# Patient Record
Sex: Female | Born: 1960 | Race: Black or African American | Hispanic: No | Marital: Married | Smoking: Never smoker
Health system: Southern US, Community
[De-identification: ages and names within clinical notes are randomized; demographics above are authoritative.]

## PROBLEM LIST (undated history)

## (undated) DIAGNOSIS — M21371 Foot drop, right foot: Principal | ICD-10-CM

## (undated) DIAGNOSIS — E7800 Pure hypercholesterolemia, unspecified: Secondary | ICD-10-CM

## (undated) DIAGNOSIS — F319 Bipolar disorder, unspecified: Secondary | ICD-10-CM

## (undated) DIAGNOSIS — I10 Essential (primary) hypertension: Secondary | ICD-10-CM

## (undated) DIAGNOSIS — F419 Anxiety disorder, unspecified: Secondary | ICD-10-CM

## (undated) HISTORY — DX: Essential (primary) hypertension: I10

## (undated) HISTORY — DX: Bipolar disorder, unspecified: F31.9

## (undated) HISTORY — DX: Pure hypercholesterolemia, unspecified: E78.00

## (undated) HISTORY — PX: PARTIAL HYSTERECTOMY: SHX80

## (undated) HISTORY — DX: Anxiety disorder, unspecified: F41.9

## (undated) HISTORY — DX: Foot drop, right foot: M21.371

## (undated) HISTORY — PX: BREAST SURGERY: SHX581

## (undated) HISTORY — PX: BREAST CYST EXCISION: SHX579

---

## 1996-10-01 HISTORY — PX: HIP SURGERY: SHX245

## 2001-07-12 ENCOUNTER — Encounter: Payer: Self-pay | Admitting: Emergency Medicine

## 2001-07-12 ENCOUNTER — Inpatient Hospital Stay (HOSPITAL_COMMUNITY): Admission: EM | Admit: 2001-07-12 | Discharge: 2001-07-14 | Payer: Self-pay | Admitting: *Deleted

## 2001-07-13 ENCOUNTER — Encounter: Payer: Self-pay | Admitting: Internal Medicine

## 2001-09-06 ENCOUNTER — Encounter: Payer: Self-pay | Admitting: Emergency Medicine

## 2001-09-07 ENCOUNTER — Inpatient Hospital Stay (HOSPITAL_COMMUNITY): Admission: EM | Admit: 2001-09-07 | Discharge: 2001-09-08 | Payer: Self-pay | Admitting: Emergency Medicine

## 2002-09-12 ENCOUNTER — Emergency Department (HOSPITAL_COMMUNITY): Admission: EM | Admit: 2002-09-12 | Discharge: 2002-09-12 | Payer: Self-pay | Admitting: Emergency Medicine

## 2002-09-12 ENCOUNTER — Encounter: Payer: Self-pay | Admitting: Emergency Medicine

## 2002-09-18 ENCOUNTER — Ambulatory Visit (HOSPITAL_COMMUNITY): Admission: RE | Admit: 2002-09-18 | Discharge: 2002-09-18 | Payer: Self-pay | Admitting: *Deleted

## 2002-10-13 ENCOUNTER — Encounter
Admission: RE | Admit: 2002-10-13 | Discharge: 2003-01-11 | Payer: Self-pay | Admitting: Physical Medicine & Rehabilitation

## 2002-12-23 ENCOUNTER — Ambulatory Visit (HOSPITAL_BASED_OUTPATIENT_CLINIC_OR_DEPARTMENT_OTHER): Admission: RE | Admit: 2002-12-23 | Discharge: 2002-12-23 | Payer: Self-pay | Admitting: General Surgery

## 2002-12-23 ENCOUNTER — Encounter (INDEPENDENT_AMBULATORY_CARE_PROVIDER_SITE_OTHER): Payer: Self-pay | Admitting: *Deleted

## 2003-03-17 ENCOUNTER — Ambulatory Visit (HOSPITAL_COMMUNITY): Admission: RE | Admit: 2003-03-17 | Discharge: 2003-03-17 | Payer: Self-pay | Admitting: *Deleted

## 2003-03-17 ENCOUNTER — Encounter: Payer: Self-pay | Admitting: *Deleted

## 2005-03-02 ENCOUNTER — Ambulatory Visit (HOSPITAL_COMMUNITY): Admission: RE | Admit: 2005-03-02 | Discharge: 2005-03-02 | Payer: Self-pay | Admitting: *Deleted

## 2005-03-12 ENCOUNTER — Ambulatory Visit (HOSPITAL_COMMUNITY): Admission: RE | Admit: 2005-03-12 | Discharge: 2005-03-12 | Payer: Self-pay | Admitting: Internal Medicine

## 2005-03-12 ENCOUNTER — Other Ambulatory Visit: Admission: RE | Admit: 2005-03-12 | Discharge: 2005-03-12 | Payer: Self-pay | Admitting: Internal Medicine

## 2005-03-25 ENCOUNTER — Emergency Department (HOSPITAL_COMMUNITY): Admission: EM | Admit: 2005-03-25 | Discharge: 2005-03-26 | Payer: Self-pay | Admitting: Emergency Medicine

## 2005-03-29 ENCOUNTER — Encounter: Admission: RE | Admit: 2005-03-29 | Discharge: 2005-03-29 | Payer: Self-pay | Admitting: Internal Medicine

## 2005-04-04 ENCOUNTER — Encounter: Admission: RE | Admit: 2005-04-04 | Discharge: 2005-04-04 | Payer: Self-pay | Admitting: Internal Medicine

## 2005-04-27 ENCOUNTER — Ambulatory Visit (HOSPITAL_BASED_OUTPATIENT_CLINIC_OR_DEPARTMENT_OTHER): Admission: RE | Admit: 2005-04-27 | Discharge: 2005-04-27 | Payer: Self-pay | Admitting: General Surgery

## 2005-04-27 ENCOUNTER — Ambulatory Visit (HOSPITAL_COMMUNITY): Admission: RE | Admit: 2005-04-27 | Discharge: 2005-04-27 | Payer: Self-pay | Admitting: General Surgery

## 2005-04-27 ENCOUNTER — Encounter (INDEPENDENT_AMBULATORY_CARE_PROVIDER_SITE_OTHER): Payer: Self-pay | Admitting: *Deleted

## 2006-06-05 ENCOUNTER — Encounter: Admission: RE | Admit: 2006-06-05 | Discharge: 2006-06-05 | Payer: Self-pay | Admitting: General Surgery

## 2006-06-13 ENCOUNTER — Encounter: Admission: RE | Admit: 2006-06-13 | Discharge: 2006-06-13 | Payer: Self-pay | Admitting: General Surgery

## 2006-06-18 ENCOUNTER — Inpatient Hospital Stay (HOSPITAL_COMMUNITY): Admission: EM | Admit: 2006-06-18 | Discharge: 2006-06-21 | Payer: Self-pay | Admitting: Emergency Medicine

## 2006-06-27 ENCOUNTER — Other Ambulatory Visit: Admission: RE | Admit: 2006-06-27 | Discharge: 2006-06-27 | Payer: Self-pay | Admitting: Obstetrics and Gynecology

## 2006-06-29 ENCOUNTER — Emergency Department (HOSPITAL_COMMUNITY): Admission: EM | Admit: 2006-06-29 | Discharge: 2006-06-29 | Payer: Self-pay | Admitting: Emergency Medicine

## 2006-08-02 ENCOUNTER — Encounter: Admission: RE | Admit: 2006-08-02 | Discharge: 2006-08-02 | Payer: Self-pay | Admitting: Internal Medicine

## 2006-09-13 ENCOUNTER — Encounter (INDEPENDENT_AMBULATORY_CARE_PROVIDER_SITE_OTHER): Payer: Self-pay | Admitting: Specialist

## 2006-09-13 ENCOUNTER — Ambulatory Visit (HOSPITAL_COMMUNITY): Admission: RE | Admit: 2006-09-13 | Discharge: 2006-09-14 | Payer: Self-pay | Admitting: Obstetrics and Gynecology

## 2006-11-26 ENCOUNTER — Encounter: Admission: RE | Admit: 2006-11-26 | Discharge: 2006-11-26 | Payer: Self-pay | Admitting: Internal Medicine

## 2007-08-07 ENCOUNTER — Encounter: Admission: RE | Admit: 2007-08-07 | Discharge: 2007-08-07 | Payer: Self-pay | Admitting: Internal Medicine

## 2008-03-02 IMAGING — CT CT PELVIS W/ CM
2 of 5 series · 14 of 32 positions shown, 19 images · IV contrast (OMNI 350 25 ML & [ID] OMNI 300)
Comparison: None.

CLINICAL DATA: Mid abdominal pain with nausea and abdominal distention.
 ABDOMEN CT WITH CONTRAST:
TECHNIQUE: Multidetector CT imaging of the abdomen was performed following the standard protocol during bolus administration of intravenous contrast.
 Contrast:  100 cc Omnipaque 300 and oral contrast.
TECHNIQUE: Multidetector CT imaging of the pelvis was performed following the standard protocol during bolus administration of intravenous contrast.

[Series 2: abd pelvis · axial · 0.70mm/px · z∈[-396,-71]mm · 8 of 85 slices shown, 13 images]
[im 10/85  soft-tissue]
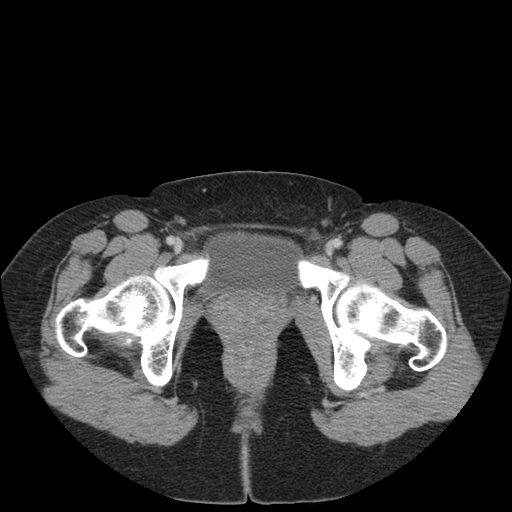
[im 10/85  bone]
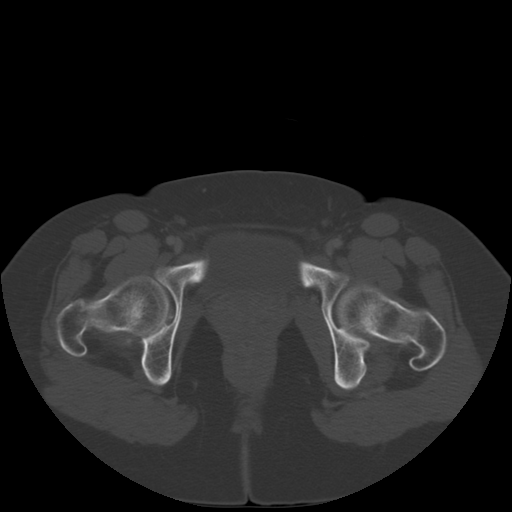
[im 19/85  soft-tissue]
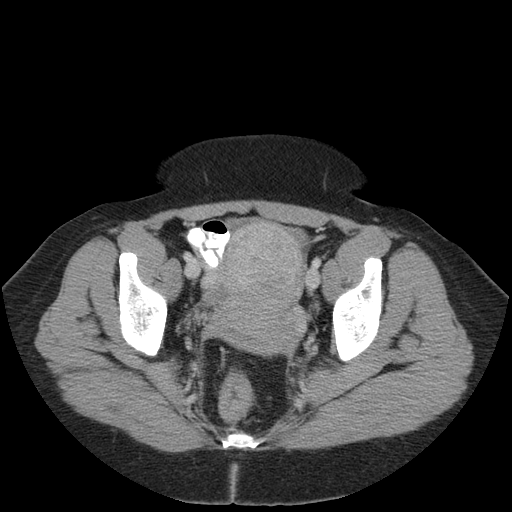
[im 29/85  soft-tissue]
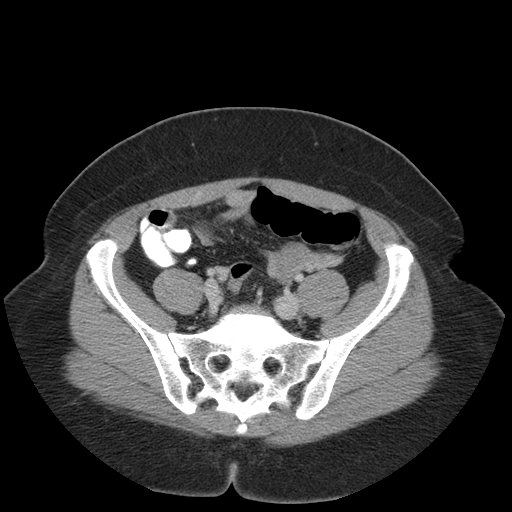
[im 38/85  soft-tissue]
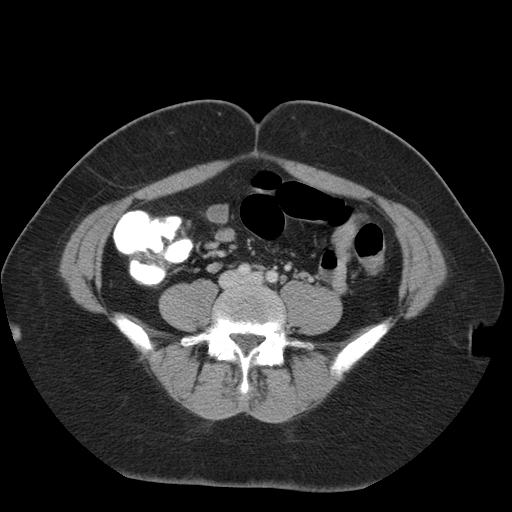
[im 47/85  soft-tissue]
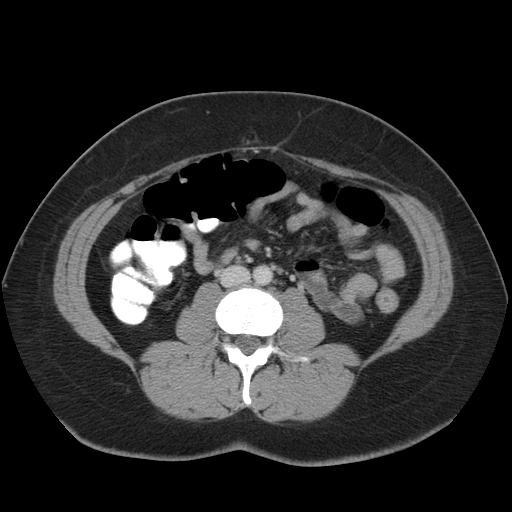
[im 47/85  lung]
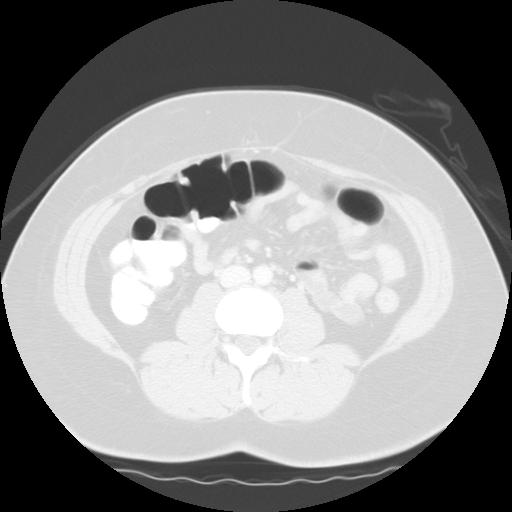
[im 57/85  soft-tissue]
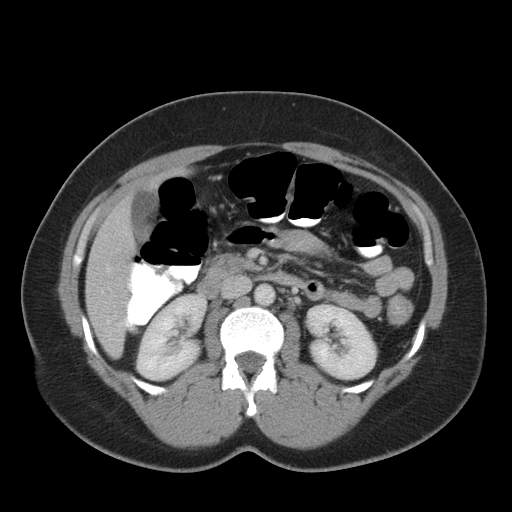
[im 57/85  lung]
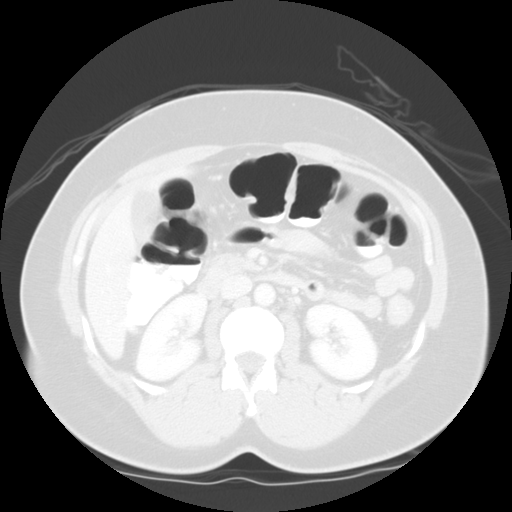
[im 66/85  soft-tissue]
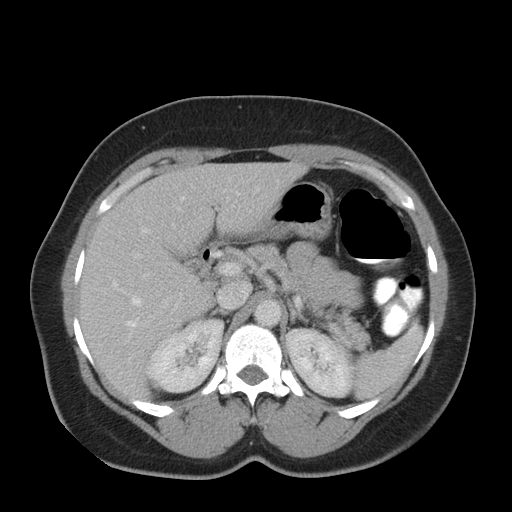
[im 66/85  lung]
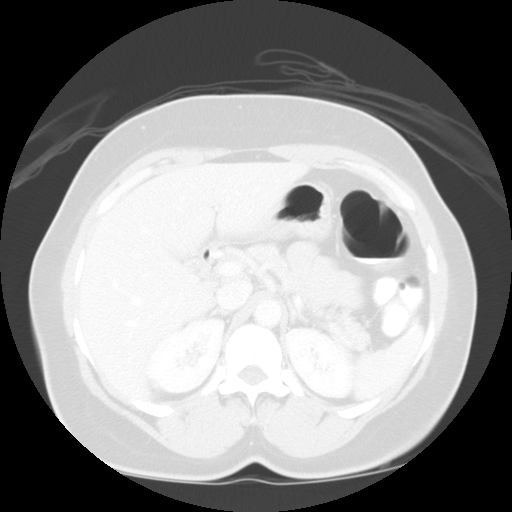
[im 75/85  soft-tissue]
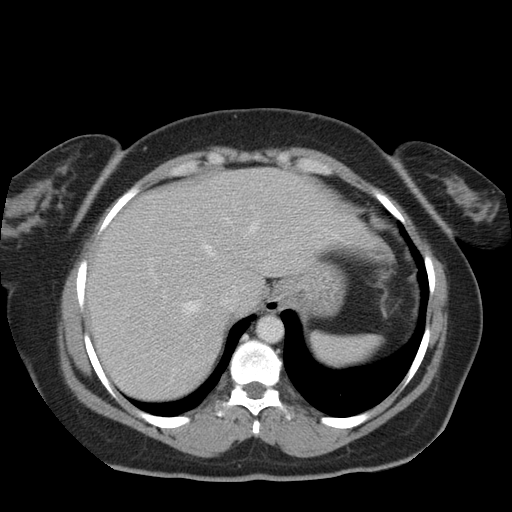
[im 75/85  lung]
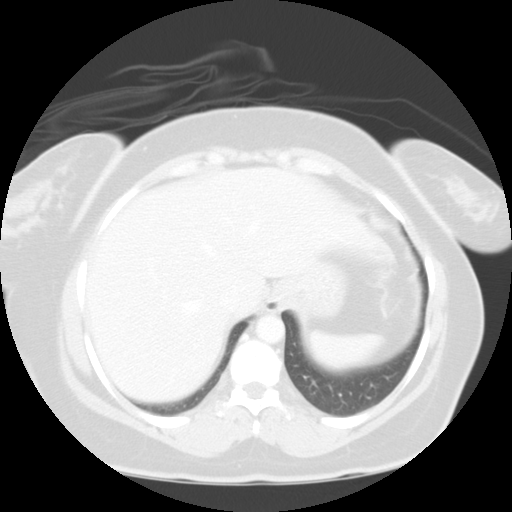

[Series 400: reformatted · sagittal · 0.85mm/px · 6 of 73 slices shown]
[im 10/73  soft-tissue]
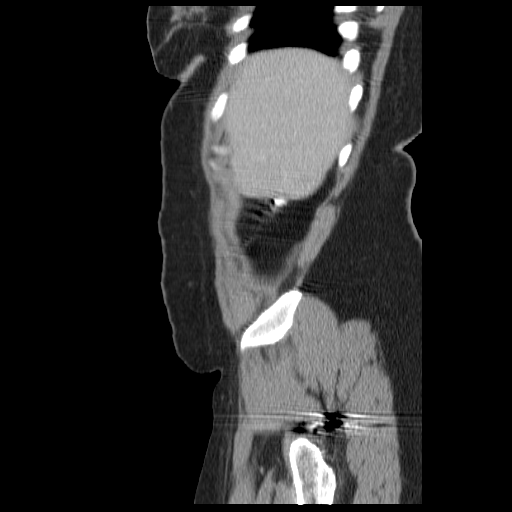
[im 19/73  soft-tissue]
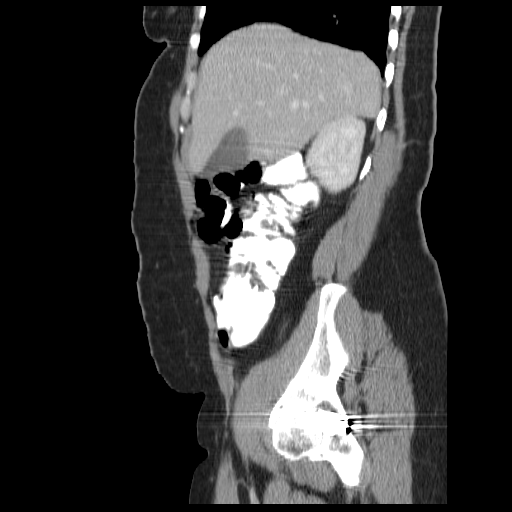
[im 28/73  soft-tissue]
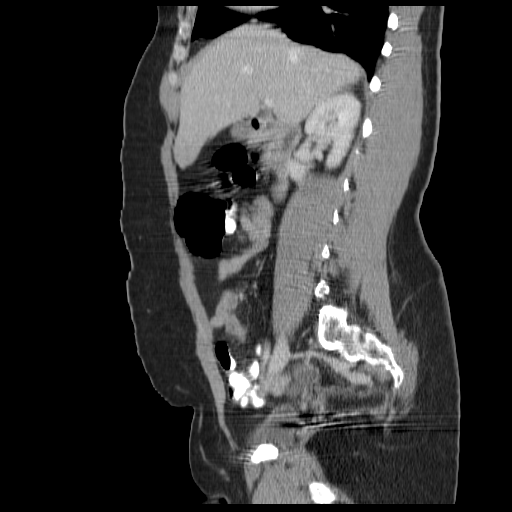
[im 37/73  soft-tissue]
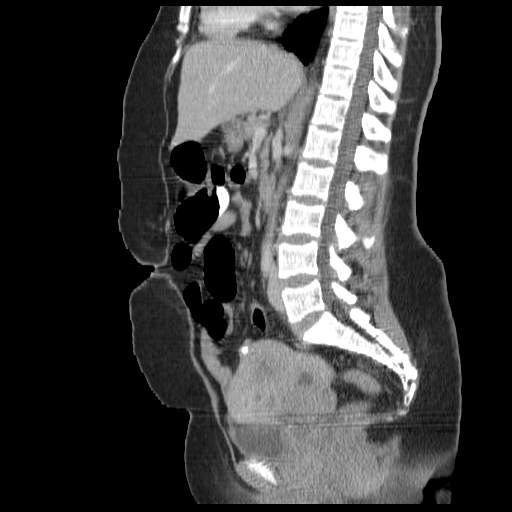
[im 46/73  soft-tissue]
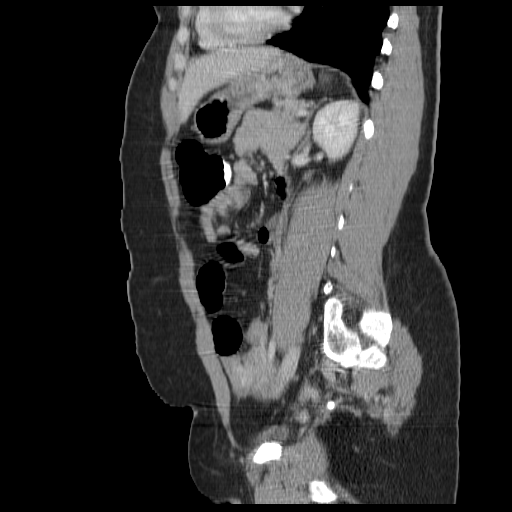
[im 55/73  soft-tissue]
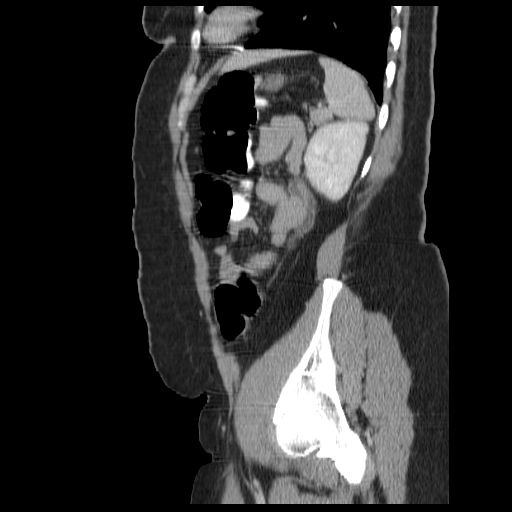

[14 of 32 positions shown; findings below may reference images not displayed]

FINDINGS: Lung bases are clear. :  The abdominal parenchymal organs are unremarkable.  There is no evidence of mass or adenopathy.  No inflammatory process or abnormal fluid collections are identified.  No other significant abnormality noted.
IMPRESSION: Negative abdomen CT.  
 PELVIS CT WITH CONTRAST:
FINDINGS: The uterus appears lobulated and enlarged, suggesting uterine fibroid disease.  The left ovary is relatively high in position, situated above the left aspect of the uterine dome. This is a variant of normal.  There is a probable small right ovarian cyst.  There is no free fluid in the cul-de-sac. No adenopathy or other acute/pathological findings. No inflammatory changes of the large or small bowel. Appendix normal.
IMPRESSION: Probable fibroid uterus and probable small right ovarian cyst ? no acute or significant findings.

## 2008-08-02 ENCOUNTER — Other Ambulatory Visit: Admission: RE | Admit: 2008-08-02 | Discharge: 2008-08-02 | Payer: Self-pay | Admitting: Internal Medicine

## 2009-03-17 ENCOUNTER — Inpatient Hospital Stay (HOSPITAL_COMMUNITY): Admission: AD | Admit: 2009-03-17 | Discharge: 2009-03-18 | Payer: Self-pay | Admitting: Obstetrics and Gynecology

## 2009-08-31 ENCOUNTER — Encounter: Admission: RE | Admit: 2009-08-31 | Discharge: 2009-08-31 | Payer: Self-pay | Admitting: Internal Medicine

## 2009-09-07 ENCOUNTER — Encounter: Admission: RE | Admit: 2009-09-07 | Discharge: 2009-09-07 | Payer: Self-pay | Admitting: Internal Medicine

## 2009-11-20 ENCOUNTER — Observation Stay (HOSPITAL_COMMUNITY): Admission: EM | Admit: 2009-11-20 | Discharge: 2009-11-21 | Payer: Self-pay | Admitting: Emergency Medicine

## 2009-11-20 ENCOUNTER — Ambulatory Visit: Payer: Self-pay | Admitting: Internal Medicine

## 2009-12-20 ENCOUNTER — Encounter: Admission: RE | Admit: 2009-12-20 | Discharge: 2009-12-20 | Payer: Self-pay | Admitting: Internal Medicine

## 2010-10-23 ENCOUNTER — Encounter: Payer: Self-pay | Admitting: Internal Medicine

## 2010-12-20 LAB — POCT CARDIAC MARKERS
CKMB, poc: <1 ng/mL — ABNORMAL LOW (ref 1.0–8.0)
Myoglobin, poc: 69.4 ng/mL (ref 12–200)
Troponin i, poc: <0.05 ng/mL (ref 0.00–0.09)

## 2010-12-20 LAB — BASIC METABOLIC PANEL
Calcium: 9.3 mg/dL (ref 8.4–10.5)
Creatinine, Ser: 0.96 mg/dL (ref 0.4–1.2)
GFR calc Af Amer: >60 mL/min (ref >60)
GFR calc non Af Amer: >60 mL/min (ref >60)
Sodium: 135 mEq/L (ref 135–145)

## 2010-12-20 LAB — LIPID PANEL
Cholesterol: 242 mg/dL — ABNORMAL HIGH (ref 0–200)
HDL: 46 mg/dL (ref >39)
LDL Cholesterol: 152 mg/dL — ABNORMAL HIGH (ref 0–99)
Triglycerides: 222 mg/dL — ABNORMAL HIGH (ref <150)

## 2010-12-20 LAB — CBC
Hemoglobin: 12.7 g/dL (ref 12.0–15.0)
MCHC: 34.1 g/dL (ref 30.0–36.0)
RBC: 4.18 MIL/uL (ref 3.87–5.11)
RDW: 14.5 % (ref 11.5–15.5)

## 2010-12-20 LAB — DIFFERENTIAL
Basophils Absolute: 0.1 10*3/uL (ref 0.0–0.1)
Basophils Relative: 1 % (ref 0–1)
Lymphocytes Relative: 35 % (ref 12–46)
Monocytes Absolute: 0.6 10*3/uL (ref 0.1–1.0)
Monocytes Relative: 6 % (ref 3–12)
Neutro Abs: 5.9 10*3/uL (ref 1.7–7.7)
Neutrophils Relative %: 57 % (ref 43–77)

## 2010-12-20 LAB — CARDIAC PANEL(CRET KIN+CKTOT+MB+TROPI)
CK, MB: 0.7 ng/mL (ref 0.3–4.0)
Relative Index: 0.3 (ref 0.0–2.5)
Relative Index: 0.5 (ref 0.0–2.5)
Total CK: 212 U/L — ABNORMAL HIGH (ref 7–177)
Troponin I: 0.04 ng/mL (ref 0.00–0.06)

## 2010-12-20 LAB — TROPONIN I: Troponin I: 0.04 ng/mL (ref 0.00–0.06)

## 2010-12-20 LAB — D-DIMER, QUANTITATIVE: D-Dimer, Quant: 0.22 ug/mL-FEU (ref 0.00–0.48)

## 2010-12-20 LAB — TSH: TSH: 1.896 u[IU]/mL (ref 0.350–4.500)

## 2011-01-08 LAB — URINALYSIS, ROUTINE W REFLEX MICROSCOPIC
Hgb urine dipstick: NEGATIVE
Nitrite: NEGATIVE
Protein, ur: NEGATIVE mg/dL
Specific Gravity, Urine: <1.005 — ABNORMAL LOW (ref 1.005–1.030)
Urobilinogen, UA: 0.2 mg/dL (ref 0.0–1.0)

## 2011-01-08 LAB — CBC
Hemoglobin: 11.5 g/dL — ABNORMAL LOW (ref 12.0–15.0)
MCHC: 34 g/dL (ref 30.0–36.0)
Platelets: 414 10*3/uL — ABNORMAL HIGH (ref 150–400)
RDW: 14.6 % (ref 11.5–15.5)

## 2011-01-08 LAB — POCT PREGNANCY, URINE: Preg Test, Ur: NEGATIVE

## 2011-02-16 NOTE — Op Note (Signed)
NAME:  Andrea Bean, Andrea Bean              ACCOUNT NO.:  [id number]   MEDICAL RECORD NO.:  [id number]          PATIENT TYPE:  OIB   LOCATION:  1509                         FACILITY:  [facility]   PHYSICIAN:  Crist Fat. Rivard, M.D. DATE OF BIRTH:  24-Dec-1960   DATE OF PROCEDURE:  09/13/2006  DATE OF DISCHARGE:                               OPERATIVE REPORT   PREOP DIAGNOSIS:  Uterine fibroids.   POSTOP DIAGNOSIS:  Uterine fibroids.   ANESTHESIA:  General, Dr. Leta Jungling.   PROCEDURE:  Laparoscopy myomectomy robotically assisted.   SURGEON:  Crist Fat. Rivard, M.D.   ASSISTANTMarquis Lunch. Lowell Guitar, P.A.   DESCRIPTION OF PROCEDURE:  After being informed of the planned procedure  with possible complications; including bleeding, infection, injury to  bowels, bladder, ureters, need for laparotomy as well as length of  procedure, and the dependent facial postop edema; informed consent is  obtained.  The patient is taken to OR #10 given general anesthesia with  endotracheal intubation without complication.  She is placed on an  sliding mattress the arms are wrapped and padded.  PAS knee-high  stockings are placed; and she is placed in a lithotomy position.  Shoulder pads are also placed.  She is prepped and draped in a sterile  fashion; and a Foley catheter is inserted in her bladder.  GYN exam  reveals an anteverted uterus about 12-14 weeks in size, mobile with a  dominant anterior fibroid, but otherwise her pelvic exam is limited by  body habitus.   A weighted speculum is inserted.  The anterior lip of the cervix is  grasped with a tenaculum and forceps; and the uterus is sounded at 10  cm.  Using a #10 RUMI manipulator we inserted easily and inflated the  balloon with 7 mL of air.  Speculum is removed; tenaculum is removed.  We then proceeded with the supraumbilical trocar placement about 5 cm  above the umbilicus.  We infiltrated with 5 mL of Marcaine 0.25;  performed a 10-mm incision with knife;  and bluntly brought this to the  fascia with S-retractors.  The fascia is identified, grasped, and  incised with Mayo forceps; and the peritoneum is entered bluntly.  A  pursestring suture of #0 Vicryl is then placed on the fascia; and a  Hassan long trocar is easily inserted and held in place with a  pursestring suture.  We can now insufflate a pneumoperitoneum with CO2  at a maximum pressure of 15 mmHg.   We then determined our trocar placement; and placed an 8 mm da Vinci  trocar 15 cm below the camera trocar on the left; and on the right side.  A third da Vinci trocar 8-mm is inserted 10 cm in the direct line in the  left flank area.  Before inserting these trocars, all the sites are  infiltrated with Marcaine 0.25.  We finally placed a 10-mm disposable  trocar 15 cm directly across our camera port on the right side of the  patient for patient-side assistance.  These trocars are all placed under  direct visualization.  The camera is then  used to evaluate the pelvic  and the abdominal cavity.  Observation:  Anterior cul-de-sac is normal;  posterior cul-de-sac is normal; both tubes are status post bilateral  tubal ligation; and both ovaries are normal.  We noted a dominant  intramural anterior and fundal fibroid measuring 5.5 cm.  A posterior,  mid body, largely pedunculated fibroid measuring 6 x 3 cm; a  pedunculated left cornual fibroid measuring 2 cm; and a subserosal  fundal fibroid measuring 1 cm.  Appendix is visualized and normal.  Liver is visualized and normal.  Gallbladder is not visualized.  No  other anomalies are observed in the abdominal pelvic cavity. We then  proceed with docking of the robot which is done without complication.   Console time now starts; and we proceeded with infiltration of the  dominant anterior fibroid using vasopressin 20 units and 20 mL  dilatation until we see complete blanching of the serosa.  Using the  monopolar scissors and bipolar PK forceps   we then incised the serosa of  the uterus on a 4-cm distance.  With our third robotic arm using a large  tenaculum we can then grasp the fibroid; and then bluntly and sharply  dissect it out of the myometrium.  It is then deposited in the posterior  cul-de-sac; and we proceeded with closure of the myometrium in two  layers with figure-of-eight stitches of #0 Vicryl.  We can then close  the serosa with a baseball suture of 2-0 Vicryl.  Hemostasis is  adequate.  We then proceed with the posterior myomectomy; and, again, we  infiltrate the serosa with vasopressin 20 units and 20 mL dilution, and  with monopolar scissors.  We incise the serosa about 1 cm away from the  uterine wall.  The tenaculum forceps grasped the fibroid; and sharply  and bluntly using bipolar PK forceps and monopolar scissors, we are able  to completely enucleate the fibroid away.  It is also deposited in the  posterior cul-de-sac; and we proceed with one-layer closure of #0 Vicryl  figure-of-eight stitches.  The serosa is partially closed with a  baseball suture of 2-0 Vicryl.  For hemostatic purposes, on the left  angle of our incision, we placed a through-and-through figure-of-eight  stitch of #0 Vicryl.  Hemostasis is now deemed adequate.  Using PK  forceps.  We cauterized the base of the left cornual fibroid, incised  it, and removed it.  It is deposited in the anterior cul-de-sac.  Finally using PK forceps and monopolar scissors, we removed the  subserosal fundal fibroid easily; and both of these surfaces are  cauterized with PK forceps   The 10-mm assistant trocar is removed to placed the Gynecare morcellater  under direct visualization.  We then proceed with systematic  morcellation of all 4 previously-mentioned fibroids without  complication.  We then irrigate the pelvis profusely with warm saline  and note a satisfactory hemostasis.  Tisseel is then applied to the anterior incision as well as to the posterior  incision; and again  hemostasis is adequate.  The [employer] robot is then undocked and all  trocars are removed under direct visualization after evacuating the  pneumoperitoneum.  The previously placed pursestring suture of the  camera trocar is closed making sure no bowel loop is protruding and  tied.  We proceeded with closure of the fascia of the now 12-mm right  flank incision with a running suture of #0 Vicryl and then skin of all  five incisions  is closed with Dermabond.   Instruments and sponge counts are complete x2.  Estimated blood loss is  100 mL.  The complete weight of all for fibroid is 150 grams.  Urine  output was 375 mL and the patient received 3400 mL of crystalloids.  The  procedure was very well tolerated by the patient who is taken to  recovery room in a well and stable condition.      Crist Fat Rivard, M.D.  Electronically Signed     SAR/MEDQ  D:  09/13/2006  T:  09/13/2006  Job:  [id number]

## 2011-02-16 NOTE — H&P (Signed)
NAME:  Andrea Bean, Andrea Bean              ACCOUNT NO.:  [id number]   MEDICAL RECORD NO.:  [id number]          PATIENT TYPE:  AMB   LOCATION:  DAY                          FACILITY:  [facility]   PHYSICIAN:  Crist Fat. Rivard, M.D. DATE OF BIRTH:  1960-11-26   DATE OF ADMISSION:  DATE OF DISCHARGE:                              HISTORY & PHYSICAL   REASON FOR ADMISSION:  Uterine fibroids with menorrhagia.   HISTORY OF PRESENT ILLNESS:  This is a 50 year old married [place]  American female, gravida 4, para 2, who consulted [name] in September 2007,  complaining of menorrhagia.  She reports an increase in her menstrual  flow in the last year although her cycle is monthly in frequency.  The  duration of her flow is about seven days and requires the use of one  tampon and one pad every hour.  She does report passing clots as large  as 3 cm.  The flow is heavy enough that it keeps her from pursuing her  social activities.  She also reports significant dysmenorrhea 48 hours  prior to the flow and continuing for another 48 hours.  Dysmenorrhea is  quoted of an intensity of 10/10 and radiates to her back.   An ultrasound performed July 10, 2006, reports an enlarged uterus  measuring 9.4 x 7.6 x 6.7 cm with an endometrial thickness of 1.2 cm at  day 31 of the cycle.  Right ovary is slightly enlarged with no cystic  structure but negative blood flow.  Left ovary is normal.  We do note an  anterior intramural fibroid that measures 4.2 x 4.2 x 4.8 cm.  A CT scan  also performed in October 2007, noted the same findings.  An endometrial  biopsy performed July 10, 2006, revealed benign proliferative  endometrium, no hyperplasia or malignancy identified.  In September of  2007, her hemoglobin was slightly decreased at 11.7 and she had a  normal TSH.  She comes today seeking surgical treatment and has  requested a myomectomy despite the fact that she has a bilateral tubal  ligation.  Mrs. Andrea Bean desires a  left invasive surgery to reduce her  risk of possible complications.  She understands the possibility of  having to request a hysterectomy at a later time.   CURRENT MEDICATIONS:  1. Amitiza 24 mcg daily.  2. Lipitor 20 mg daily.  3. Trazodone 50 mg 1-3 times daily.  4. Nexium 40 mg daily.  5. Cymbalta 60 mg in the morning.  6. Abilify 10 mg daily.  7. MiraLax daily as needed.   ALLERGIES:  NO KNOWN DRUG ALLERGIES.   REVIEW OF SYSTEMS:  CONSTITUTIONAL:  Negative.  PSYCHIATRIC:  Patient is  currently followed by Dr. Evelene Croon for bipolar disorder with a schizophrenia  component.  She also has depression and superimposed panic attacks.  CARDIOVASCULAR:  Negative.  GASTROINTESTINAL:  Negative.  GENITOURINARY:  Negative, otherwise specified in HPI.  UROLOGICAL:  Negative.   PAST MEDICAL HISTORY:  1. Bilateral tubal ligation.  2. Status post removal of right breast fibroadenoma.  3. Status post hip surgery.  4. Bipolar disorder  with schizophrenia component and superimposed      panic attacks.  5. Migraines.  6. Hypercholesterolemia.  7. Past history of gastric ulcer with pain medication.  8. Gravida 4, para 2, abortus 2.   FAMILY HISTORY:  Paternal grandmother with breast cancer.  Son with  leukemia.   SOCIAL HISTORY:  Married.  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   OTHER LABORATORY EVALUATIONS:  Pap smear June 27, 2006, normal.   PHYSICAL EXAMINATION:  VITAL SIGNS:  Current weight is 183 pounds for a  height of 5 feet 6 inches.  Blood pressure 120/88.  HEENT:  Normal.  NECK:  Thyroid not enlarged.  CHEST:  Clear.  CARDIOVASCULAR:  Regular rate and rhythm.  BREASTS:  No mass, discharge or nipple retractions.  BACK:  No CVA tenderness.  ABDOMEN:  No tenderness, masses or hepatosplenomegaly.  PELVIC:  Normal external genitalia, normal vagina.  Cervix is normal.  Uterus is slightly increased in size, 8-10 weeks, mobile and nontender.  Adnexa are negative.  RECTOVAGINAL:   Negative.  EXTREMITIES:  Negative.  NEUROLOGIC:  Within normal limits.   ASSESSMENT:  Uterine fibroids with menorrhagia and dysmenorrhea in a  patient desiring myomectomy.   PLAN:  We have offered robotic assisted myomectomy. The procedure has  been thoroughly reviewed with the patient including length of procedure  which is longer than traditional surgery including the possibility of  requiring a laparotomy.  Hospital stay and recovery were also reviewed.  Possible risks of the procedure were reviewed including, but not limited  to, bleeding, infection, injury to bladder, bowels or ureters, need for  laparotomy.   Other options of treatment were reviewed with the patient including  medical treatment of symptoms as well as hysterectomy.  The patient  understands all her options and is requesting a robotic assisted  myomectomy despite having had a previous tubal ligation.  Fertility  issues are not a concern but the patient does request a minimally  invasive surgery without removal of any organ.  She is aware that  fibroids can grow again and she is aware that her symptoms could  persist.  She understands that she may require more treatment in the  future.      Crist Fat Rivard, M.D.  Electronically Signed     SAR/MEDQ  D:  09/12/2006  T:  09/12/2006  Job:  [id number]

## 2011-02-16 NOTE — Op Note (Signed)
NAME:  Andrea Bean, Andrea Bean              ACCOUNT NO.:  [id number]   MEDICAL RECORD NO.:  [id number]          PATIENT TYPE:  AMB   LOCATION:  DSC                          FACILITY:  [facility]   PHYSICIAN:  Angelia Mould. Derrell Lolling, M.D.DATE OF BIRTH:  Feb 27, 1961   DATE OF PROCEDURE:  04/27/2005  DATE OF DISCHARGE:                                 OPERATIVE REPORT   PREOPERATIVE DIAGNOSIS:  Right nipple discharge.   POSTOPERATIVE DIAGNOSIS:  Right nipple discharge.   OPERATION PERFORMED:  Excision of right breast tissue, subareolar, and  excision of major lactiferous duct.   SURGEON:  Angelia Mould. Derrell Lolling, M.D.   OPERATIVE INDICATIONS:  This is a 50 year old black female who has had a  persistent drainage from her right nipple for 6 months. This has sometimes  been bloody sometimes light yellow. Mammograms and ultrasound and ductogram  failed to demonstrate a mass or a filling defect. The drainage persists and  she would like to have this area excised because she is fearful of cancer.  On exam, there is no palpable mass but I can elicit a slightly cloudy  discharge from the right nipple in one of the central ducts.   TECHNIQUE:  The patient brought to operating room. General anesthesia was  induced. The right breast was prepped and draped in sterile fashion. The  patient was identified. I was able to elicit the drainage from the central  right nipple in the center of the nipple and I was able to pass a lacrimal  duct probe easily into this duct. 0.5% Marcaine with epinephrine was used as  local infiltration anesthetic. A curved incision was made at the lower rim  of the areolar margin. Dissection was carried down into the breast tissue in  the subareolar area. We dissected medially until we encountered the lacrimal  duct probe. We dissected around this and marked this site with a Vicryl  suture and then removed the probe. We then dissected a golf ball sized area  of subareolar tissue encompassing about  2.5 cm in diameter around the  involved duct. I did not palpate any mass. I did not see any blood. The  specimen was sent to the pathology department with appropriate clinical  history attached. The wound was irrigated with saline. Hemostasis was  excellent and achieved with electrocautery. Subcutaneous tissue was closed  with interrupted sutures of 3-0  Vicryl. Skin closed with a running subcuticular suture of 4-0 Monocryl and  Steri-Strips. Clean bandages were placed and the patient taken recovery room  in stable condition. Estimated blood loss was about 10 mL. Complications  none. Sponge, needle, and instrument counts were correct.       HMI/MEDQ  D:  04/27/2005  T:  04/27/2005  Job:  [id number]   cc:   Juline Patch, M.D.  [address]  [place], [place] [address]  Fax: [phone]   Ollen Gross, M.D.
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
## 2011-02-16 NOTE — Op Note (Signed)
NAME:  Andrea Bean, Andrea Bean                        ACCOUNT NO.:  [id number]   MEDICAL RECORD NO.:  [id number]                   PATIENT TYPE:  AMB   LOCATION:  DSC                                  FACILITY:  [facility]   PHYSICIAN:  Angelia Mould. Derrell Lolling, M.D.             DATE OF BIRTH:  1960/11/21   DATE OF PROCEDURE:  12/23/2002  DATE OF DISCHARGE:                                 OPERATIVE REPORT   PREOPERATIVE DIAGNOSIS:  Right breast mass.   POSTOPERATIVE DIAGNOSIS:  Right breast mass.   OPERATION PERFORMED:  Excisional biopsy, right breast mass.   SURGEON:  Angelia Mould. Derrell Lolling, M.D.   INDICATIONS FOR PROCEDURE:  The patient is a 50 year old black female who  has had a lump in the right breast very close to the sternum for about three  years.  She states that this is painful but has been stable in size and she  has a fear that this is cancer.  She has been followed very carefully by  Soyla Murphy. Renne Crigler, M.D. and Juline Patch, M.D. and has had serial breast  imaging which shows no abnormality except for a stable nodule about 1.4 cm  consistent with a lipoma.  Because of her concern about the diagnosis and  the painful nature of this, she is brought to the operating room for  excisional biopsy.   DESCRIPTION OF PROCEDURE:  The patient was placed supine on the operating  table.  She was monitored and sedated by the anesthesia department.  The  right breast was prepped and draped in a sterile fashion.  1% Xylocaine with  epinephrine was used as a local infiltration anesthetic.  A transverse  incision was made in the most medial aspect of the right breast at about the  3 o'clock position.  This was just lateral to the sternum.  Dissection was  carried down into the subcutaneous tissue and the breast tissue and I  removed a 1.5 cm area of fatty tissue consistent with a lipoma.  I was  careful to debride all the tissue in this area.  I did not find any tissue  that felt like a cancer or a  fibroadenoma.  This tissue was sent for routine  histology.  Hemostasis was excellent and achieved with electrocautery.  The  wound was irrigated with saline.  The skin was closed with a running  subcuticular suture of 4-0  Vicryl and Steri-Strips.  Clean bandages were placed and the patient taken  to the recovery room in stable condition.  The estimated blood loss was  about 10 ml.  Complications were none.  Sponge, needle and instrument counts  were correct.  Angelia Mould. Derrell Lolling, M.D.    HMI/MEDQ  D:  12/23/2002  T:  12/23/2002  Job:  [id number]   cc:   Juline Patch, M.D.  [address]  [place], [place] [address]  Fax: [phone]   Soyla Murphy. Renne Crigler, M.D.  [address]  [place]  [place] [address]  Fax: [phone]

## 2011-02-16 NOTE — Discharge Summary (Signed)
Andrea Bean, Andrea Bean              ACCOUNT NO.:  [id number]   MEDICAL RECORD NO.:  [id number]          PATIENT TYPE:  INP   LOCATION:  5729                         FACILITY:  [facility]   PHYSICIAN:  Hillery Aldo, M.D.   DATE OF BIRTH:  12/17/1960   DATE OF ADMISSION:  06/17/2006  DATE OF DISCHARGE:  06/21/2006                                 DISCHARGE SUMMARY   DISCHARGE DIAGNOSES:  1. Abdominal pain of uncertain etiology.  2. Chest pain of uncertain etiology.  3. Bipolar disorder.  4. Constipation.  5. Dyslipidemia.  6. Uterine fibroid disease.  7. Small right ovarian cyst.   DISCHARGE MEDICATIONS:  1. Lipitor 20 mg daily.  2. Abilify 10 mg daily.  3. Alprazolam 1 mg q.4h. p.r.n.  4. Cymbalta 30 mg daily.   CONSULTATIONS:  None.   PROCEDURES AND DIAGNOSTIC STUDIES:  1. Acute abdominal series on June 17, 2006, showed clear lungs      bilaterally.  The cardiopericardial silhouette was within normal limits      for size.  There was no acute cardiopulmonary process and a nonspecific      bowel gas pattern.  2. CT scan of the abdomen and pelvis on June 20, 2006, showed an      enlarged and lobulated uterus suggesting uterine fibroid disease.      There was also a small right ovarian cyst with no other acute or      significant findings.  The abdominal portion of the CT was negative.   DISCHARGE LABORATORY VALUES:  Sodium was 143, potassium 4.4, chloride 107,  bicarb 29, BUN 3, creatinine 1, glucose 96, total bilirubin 0.4, alkaline  phosphatase 78, AST 31, ALT 29, total protein 8.6, albumin 4.3, calcium  10.3.  White blood cell count was 9.5, hemoglobin 12.5, hematocrit 37,  platelet count 555. Urine culture showed insignificant growth.  Cardiac  enzymes were negative x3.  Cholesterol was 220, triglycerides 210, HDL 39,  LDL 139.  TSH was 2.615.  Fecal occult blood testing was negative.   HOSPITAL COURSE BY PROBLEM:  1. Nonspecific complaints of chest and abdominal  pain:  The patient was      admitted initially for chest pain but over the course of her      hospitalization, began to complain more of abdominal pain.  She also      complained of constipation.  Because of her initial chest pain      complaints, cardiac enzymes were cycled q.8h. x3 and were found to be      negative.  Her EKG was unremarkable except for sinus tachycardia.      During the course of her hospitalization, she had no further complaints      of chest pain.  She did complain of some abdominal pain which she      thought might be secondary to constipation.  She was put on aggressive      bowel management and had several stools prior to discharge.  Because of      her ongoing complaints of abdominal pain, a CT scan of the abdomen  and      pelvis were obtained.  The source of her abdominal pain was likely due      to constipation or possibly the ovarian cyst.  Nevertheless, her      abdomen never had any worrisome signs of acute abdomen.  She is stable      for discharge with normal electrolytes and liver function studies.   1. Bipolar disorder:  The patient does have a past medical history of      bipolar disorder.  Some of her symptomatology could have been secondary      to somatization.  Nevertheless, any underlying physiological cause of      her complaints was fully evaluated.  She will be discharged on her      usual regimen of psychotropic medications.   1. Dyslipidemia:  The patient did have elevated cholesterol and LDL with a      low HDL on her lipid profile.  The patient states that she has been on      fish oil supplements for at least six months at the direction of her      primary care physician.  Given this, we will go ahead and start her on      statin therapy.  She should follow up with Dr. Ricki Miller in approximately      six weeks time for recheck of her liver function studies and fasting      lipid panel to determine if she needs any further dose titration of her       statin.   DISPOSITION:  The patient is stable for discharge home.  She is instructed  to have Dr. Ricki Miller refer her to an OB/GYN for ongoing assessment and possible  treatment of her fibroid disease.  She is not currently anemic and so no  immediate need for treatment was necessary in the hospital.      Hillery Aldo, M.D.  Electronically Signed     CR/MEDQ  D:  06/21/2006  T:  06/23/2006  Job:  [id number]   cc:   Juline Patch, M.D.  Georgiana Spinner, M.D.

## 2011-02-16 NOTE — Op Note (Signed)
NAME:  Andrea Bean, Andrea Bean              ACCOUNT NO.:  [id number]   MEDICAL RECORD NO.:  [id number]          PATIENT TYPE:  AMB   LOCATION:  ENDO                         FACILITY:  [facility]   PHYSICIAN:  Georgiana Spinner, M.D.    DATE OF BIRTH:  04-24-1961   DATE OF PROCEDURE:  03/02/2005  DATE OF DISCHARGE:                                 OPERATIVE REPORT   PROCEDURES:  Upper endoscopy with Savary dilation.   INDICATIONS:  Dysphagia.   ANESTHESIA:  Demerol 100 mg, Versed 10 mg.   DESCRIPTION OF PROCEDURE:  With the patient mildly sedated in the left  lateral decubitus position in room 2 of radiology at [facility],  the Olympus videoscopic endoscope was inserted in the mouth and passed under  direct vision through the esophagus which appeared normal. There was no  evidence of Barrett's. We entered into the stomach. The fundus, body,  antrum, duodenal bulb, and second portion of the duodenum were visualized.  From this point, the endoscope was slowly withdrawn taking circumferential  views of the duodenal mucosa until the endoscope had been pulled back into  the stomach and placed in retroflexion to view the stomach from below. The  endoscope was then straightened and a guidewire was passed. The endoscope  was withdrawn taking circumferential views of the remaining gastric and  esophageal mucosa. Subsequently over the guidewire was passed an 18 Savary  dilator with minimal resistance. This done under fluoroscopic guidance. The  guidewire and the dilator were then removed. There was no heme seen on the  dilator and the procedure was terminated. The patient's vital signs and  pulse oximeter remained stable. The patient tolerated procedure well without  complications.   FINDINGS:  Narrowing of distal esophagus seen on x-ray dilated to 18 Savary  dilation.   PLAN:  Await clinical response.      GMO/MEDQ  D:  03/02/2005  T:  03/02/2005  Job:  [id number]

## 2013-01-27 ENCOUNTER — Encounter: Payer: Self-pay | Admitting: Neurology

## 2013-01-27 ENCOUNTER — Ambulatory Visit (INDEPENDENT_AMBULATORY_CARE_PROVIDER_SITE_OTHER): Payer: 59 | Admitting: Neurology

## 2013-01-27 VITALS — BP 158/98 | HR 76 | Temp 97.9°F | Ht 65.5 in | Wt 200.0 lb

## 2013-01-27 DIAGNOSIS — R202 Paresthesia of skin: Secondary | ICD-10-CM

## 2013-01-27 DIAGNOSIS — M79609 Pain in unspecified limb: Secondary | ICD-10-CM

## 2013-01-27 DIAGNOSIS — R209 Unspecified disturbances of skin sensation: Secondary | ICD-10-CM

## 2013-01-27 DIAGNOSIS — M216X9 Other acquired deformities of unspecified foot: Secondary | ICD-10-CM

## 2013-01-27 DIAGNOSIS — M21371 Foot drop, right foot: Secondary | ICD-10-CM

## 2013-01-27 HISTORY — DX: Foot drop, right foot: M21.371

## 2013-01-27 MED ORDER — GABAPENTIN 100 MG PO CAPS: ORAL_CAPSULE | ORAL | Status: DC
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
## 2013-01-27 NOTE — Patient Instructions (Addendum)
I do want to suggest a few things today:  I would like to do a L spine MRI with contrast and do some blood work, including inflammatory markers, thyroid function, muscle enzymes.  Remember to drink plenty of fluid, eat healthy meals and do not skip any meals. Try to eat protein with a every meal and eat a healthy snack such as fruit or nuts in between meals. Try to keep a regular sleep-wake schedule and try to exercise daily, particularly in the form of walking, 20-30 minutes a day, if you can.   As far as your medications are concerned, I would like to suggest a trial of Neurontin, starting at 300 mg at night. He will bring this up each week by 100 mg to up to 3 pills at night which is 300 mg total. This is going to hopefully help you sleep better and relieve some of the abnormal tingling sensations, pins and needles sensation as well as pain in her leg. Most common side effect of this medication is sedation, so unless you know how it affects you please do not drive the very next day.  I would like to see you back in 3 months, sooner if we need to. Please call us with any interim questions, concerns, problems, updates or refill requests.  Please also call us for any test results so we can go over those with you on the phone. Brett Canales is my clinical assistant and will answer any of your questions and relay your messages to me and also relay most of my messages to you.  Our phone number is [phone]. We also have an after hours call service for urgent matters and there is a physician on-call for urgent questions. For any emergencies you know to call 911 or go to the nearest emergency room.

## 2013-01-29 LAB — CBC WITH DIFFERENTIAL
Basos: 1 % (ref 0–3)
Eos: 3 % (ref 0–5)
HCT: 41.1 % (ref 34.0–46.6)
Lymphocytes Absolute: 2.9 10*3/uL (ref 0.7–3.1)
MCV: 90 fL (ref 79–97)
Monocytes: 6 % (ref 4–12)
Neutrophils Absolute: 3 10*3/uL (ref 1.4–7.0)
RBC: 4.58 x10E6/uL (ref 3.77–5.28)
WBC: 6.5 10*3/uL (ref 3.4–10.8)

## 2013-01-29 LAB — COMPREHENSIVE METABOLIC PANEL
AST: 19 IU/L (ref 0–40)
Albumin: 4.5 g/dL (ref 3.5–5.5)
Alkaline Phosphatase: 76 IU/L (ref 39–117)
BUN/Creatinine Ratio: 10 (ref 9–23)
BUN: 9 mg/dL (ref 6–24)
Chloride: 102 mmol/L (ref 97–108)
Creatinine, Ser: 0.89 mg/dL (ref 0.57–1.00)
GFR calc Af Amer: 85 mL/min/{1.73_m2} (ref >59)
Globulin, Total: 3.3 g/dL (ref 1.5–4.5)
Sodium: 140 mmol/L (ref 134–144)
Total Bilirubin: 0.1 mg/dL (ref 0.0–1.2)

## 2013-01-29 LAB — CK TOTAL AND CKMB (NOT AT ARMC): Total CK: 132 U/L (ref 24–173)

## 2013-01-29 NOTE — Progress Notes (Signed)
Quick Note:  Please call and advise the patient that the recent labs we checked were within normal limits for the most part. We checked vitamin D level, cell count, blood sugar, diabetes marker, electrolytes, kidney function, liver function, thyroid function, inflammatory markers, muscle enzymes. Everything was normal with the exception of low vitamin D and an increase in platelets. I am not sure what the significance of a high platelet count says that she should discuss this with her primary care physician as well as starting vitamin D supplementation. We can fax her lab results to her physician if she tells [name] who we should fax it to.  Please remind patient to keep any upcoming appointments and to call us with any interim questions, concerns, problems or updates. Thanks,  Huston Foley, MD, PhD    ______

## 2013-01-30 NOTE — Progress Notes (Signed)
Quick Note:  Spoke with patient and relayed results of blood work. Patient understood and had no questions. Ms Sheriff requested that these results be sent to her PCP: Dr Juline Patch, Ph [phone], Fax [phone]. ______

## 2013-02-12 DIAGNOSIS — M79609 Pain in unspecified limb: Secondary | ICD-10-CM

## 2013-02-13 ENCOUNTER — Other Ambulatory Visit: Payer: Self-pay | Admitting: Neurology

## 2013-02-13 DIAGNOSIS — M21371 Foot drop, right foot: Secondary | ICD-10-CM

## 2013-02-13 DIAGNOSIS — M79609 Pain in unspecified limb: Secondary | ICD-10-CM

## 2013-02-13 DIAGNOSIS — R202 Paresthesia of skin: Secondary | ICD-10-CM

## 2013-02-16 ENCOUNTER — Telehealth: Payer: Self-pay | Admitting: *Deleted

## 2013-02-16 NOTE — Progress Notes (Signed)
Quick Note:  Please call and advise the patient that the recent scan we did was within normal limits. We did a Lumbar spine MRI with contrast, which showed normal findings. No further action is required on this test at this time. Please remind patient to keep any upcoming appointments or tests and to call us with any interim questions, concerns, problems or updates. Thanks,  Huston Foley, MD, PhD   ______

## 2013-02-16 NOTE — Telephone Encounter (Signed)
Message copied by Avie Echevaria on Mon Feb 16, 2013  4:41 PM ------      Message from: Huston Foley      Created: Mon Feb 16, 2013  8:53 AM       Please call and advise the patient that the recent scan we did was within normal limits. We did a Lumbar spine MRI with contrast, which showed normal findings. No further action is required on this test at this time. Please remind patient to keep any upcoming appointments or tests and to call us with any interim questions, concerns, problems or updates. Thanks,       Huston Foley, MD, PhD             ------

## 2013-02-16 NOTE — Telephone Encounter (Signed)
Voice message left requesting a call-back. 

## 2013-02-24 NOTE — Telephone Encounter (Signed)
Spoke with patient and relayed the results of their MRI as well as Dr Athar's advise or recommendations.  The patient was also reminded of any future appointments.  The patient understood and had no questions.  

## 2013-02-24 NOTE — Progress Notes (Signed)
Quick Note:  Spoke with patient and relayed results of her Lumbar scan. Patient understood and had no questions.  ______

## 2013-04-28 ENCOUNTER — Ambulatory Visit: Payer: 59 | Admitting: Neurology

## 2013-06-22 ENCOUNTER — Ambulatory Visit (INDEPENDENT_AMBULATORY_CARE_PROVIDER_SITE_OTHER): Payer: Medicare PPO | Admitting: Pulmonary Disease

## 2013-06-22 ENCOUNTER — Encounter: Payer: Self-pay | Admitting: Pulmonary Disease

## 2013-06-22 VITALS — BP 104/78 | HR 70 | Temp 98.3°F | Ht 65.5 in | Wt 198.6 lb

## 2013-06-22 DIAGNOSIS — G4733 Obstructive sleep apnea (adult) (pediatric): Secondary | ICD-10-CM | POA: Insufficient documentation

## 2013-06-22 NOTE — Assessment & Plan Note (Signed)
The patient has had a home sleep test but apparently it is abnormal, but I do not have access to that currently.  I had a long discussion with her about the pathophysiology of sleep apnea, including its impact to her quality of life and cardiovascular health.  It certainly sounds from her history that she has clinically significant sleep apnea.  Will obtain her home sleep test, and if indeed abnormal, will discuss treatment options.  I have also encouraged her to work aggressively on weight loss.

## 2013-06-22 NOTE — Patient Instructions (Addendum)
Will get your sleep study results and call you to discuss treatment options. Work on [employer] loss Will arrange followup once we decide on treatment options.

## 2013-06-22 NOTE — Progress Notes (Signed)
Subjective:    Patient ID: Andrea Bean, female    DOB: Feb 05, 1961, 52 y.o.   MRN: [id number]  HPI The patient is a 52 year old female who I've been asked to see for treatment of obstructive sleep apnea.  She has apparently had a home sleep test, with the results not available at this time.  She has been noted to have loud snoring, as well as an abnormal breathing pattern during sleep.  She is also noted gasping arousals.  She has frequent awakenings at night, and has not rested in the mornings upon arising.  She notes definite sleep pressure during the day with inactivity and may take a nap at times.  She also falls asleep easily watching television or movies in the evening.  She denies any sleepiness with driving shorter distances, but does have an issue with longer distances.  The patient states that her weight is up 30 pounds over the last 2 years, and her Epworth sleepiness score today is 8.   Sleep Questionnaire What time do you typically go to bed?( Between what hours) 11 11 at 1030 on 06/22/13 by Maisie Fus, CMA How long does it take you to fall asleep? 1-2 hours 1-2 hours at 1030 on 06/22/13 by Maisie Fus, CMA How many times during the night do you wake up? 4 4 at 1030 on 06/22/13 by Maisie Fus, CMA What time do you get out of bed to start your day? 0800 0800 at 1030 on 06/22/13 by Maisie Fus, CMA Do you drive or operate heavy machinery in your occupation? No No at 1030 on 06/22/13 by Maisie Fus, CMA How much has your weight changed (up or down) over the past two years? (In pounds) 30 lb (13.608 kg)30 lb (13.608 kg) increase at 1030 on 06/22/13 by Maisie Fus, CMA Have you ever had a sleep study before? Yes Yes at 1030 on 06/22/13 by Maisie Fus, CMA If yes, location of study? Home Sleep Test Home Sleep Test at 1030 on 06/22/13 by Maisie Fus, CMA If yes, date of study? 01/2013 01/2013 at 1030 on 06/22/13 by Maisie Fus, CMA Do you  currently use CPAP? No No at 1030 on 06/22/13 by Maisie Fus, CMA Do you wear oxygen at any time? No No at 1030 on 06/22/13 by Maisie Fus, CMA   Review of Systems  Constitutional: Positive for unexpected weight change. Negative for fever.  HENT: Positive for congestion and dental problem. Negative for ear pain, nosebleeds, sore throat, rhinorrhea, sneezing, trouble swallowing, postnasal drip and sinus pressure.   Eyes: Negative for redness and itching.  Respiratory: Positive for shortness of breath. Negative for cough, chest tightness and wheezing.   Cardiovascular: Positive for palpitations ( irregular heartbeats). Negative for leg swelling.  Gastrointestinal: Negative for nausea and vomiting.  Genitourinary: Negative for dysuria.  Musculoskeletal: Positive for joint swelling ( hand/feet) and arthralgias.  Skin: Negative for rash.  Neurological: Positive for headaches.  Hematological: Does not bruise/bleed easily.  Psychiatric/Behavioral: Positive for dysphoric mood. The patient is nervous/anxious.        Objective:   Physical Exam Constitutional:  Obese female, no acute distress  HENT:  Nares patent without discharge, +enlarged turbinates.  Oropharynx without exudate, palate and uvula are elongated.   Eyes:  Perrla, eomi, no scleral icterus  Neck:  No JVD, ?mild TMG  Cardiovascular:  Normal rate, regular rhythm, no rubs or gallops.  No murmurs  Intact distal pulses  Pulmonary :  Normal breath sounds, no stridor or respiratory distress   No rales, rhonchi, or wheezing  Abdominal:  Soft, nondistended, bowel sounds present.  No tenderness noted.   Musculoskeletal:  No lower extremity edema noted.  Lymph Nodes:  No cervical lymphadenopathy noted  Skin:  No cyanosis noted  Neurologic:  Alert, appropriate, moves all 4 extremities without obvious deficit.         Assessment & Bean:

## 2013-08-19 ENCOUNTER — Encounter (HOSPITAL_COMMUNITY): Payer: Self-pay

## 2018-12-05 ENCOUNTER — Other Ambulatory Visit: Payer: Self-pay

## 2018-12-05 ENCOUNTER — Inpatient Hospital Stay (HOSPITAL_COMMUNITY)
Admission: EM | Admit: 2018-12-05 | Discharge: 2018-12-08 | DRG: 918 | Disposition: A | Discharge status: Other Acute Inpatient Hospital | Payer: Medicare PPO | Attending: Internal Medicine | Admitting: Internal Medicine

## 2018-12-05 ENCOUNTER — Ambulatory Visit (HOSPITAL_COMMUNITY)
Admission: RE | Admit: 2018-12-05 | Discharge: 2018-12-05 | Disposition: A | Discharge status: Home / Self Care | Payer: Medicare PPO | Source: Home / Self Care | Attending: Psychiatry | Admitting: Psychiatry

## 2018-12-05 ENCOUNTER — Encounter (HOSPITAL_COMMUNITY): Payer: Self-pay | Admitting: Emergency Medicine

## 2018-12-05 ENCOUNTER — Encounter (HOSPITAL_COMMUNITY): Payer: Self-pay | Admitting: Behavioral Health

## 2018-12-05 DIAGNOSIS — Z79899 Other long term (current) drug therapy: Secondary | ICD-10-CM

## 2018-12-05 DIAGNOSIS — F600 Paranoid personality disorder: Secondary | ICD-10-CM | POA: Insufficient documentation

## 2018-12-05 DIAGNOSIS — T50901A Poisoning by unspecified drugs, medicaments and biological substances, accidental (unintentional), initial encounter: Secondary | ICD-10-CM | POA: Diagnosis present

## 2018-12-05 DIAGNOSIS — D72829 Elevated white blood cell count, unspecified: Secondary | ICD-10-CM | POA: Diagnosis present

## 2018-12-05 DIAGNOSIS — R45851 Suicidal ideations: Secondary | ICD-10-CM | POA: Insufficient documentation

## 2018-12-05 DIAGNOSIS — N179 Acute kidney failure, unspecified: Secondary | ICD-10-CM | POA: Diagnosis not present

## 2018-12-05 DIAGNOSIS — E860 Dehydration: Secondary | ICD-10-CM | POA: Diagnosis present

## 2018-12-05 DIAGNOSIS — T50902A Poisoning by unspecified drugs, medicaments and biological substances, intentional self-harm, initial encounter: Principal | ICD-10-CM | POA: Diagnosis present

## 2018-12-05 DIAGNOSIS — R454 Irritability and anger: Secondary | ICD-10-CM | POA: Insufficient documentation

## 2018-12-05 DIAGNOSIS — Z90711 Acquired absence of uterus with remaining cervical stump: Secondary | ICD-10-CM

## 2018-12-05 DIAGNOSIS — Y92009 Unspecified place in unspecified non-institutional (private) residence as the place of occurrence of the external cause: Secondary | ICD-10-CM

## 2018-12-05 DIAGNOSIS — I10 Essential (primary) hypertension: Secondary | ICD-10-CM | POA: Diagnosis not present

## 2018-12-05 DIAGNOSIS — F312 Bipolar disorder, current episode manic severe with psychotic features: Secondary | ICD-10-CM | POA: Insufficient documentation

## 2018-12-05 DIAGNOSIS — T1491XA Suicide attempt, initial encounter: Secondary | ICD-10-CM

## 2018-12-05 DIAGNOSIS — E7800 Pure hypercholesterolemia, unspecified: Secondary | ICD-10-CM | POA: Diagnosis present

## 2018-12-05 DIAGNOSIS — R4689 Other symptoms and signs involving appearance and behavior: Secondary | ICD-10-CM

## 2018-12-05 DIAGNOSIS — F319 Bipolar disorder, unspecified: Secondary | ICD-10-CM | POA: Diagnosis present

## 2018-12-05 DIAGNOSIS — F419 Anxiety disorder, unspecified: Secondary | ICD-10-CM | POA: Diagnosis present

## 2018-12-05 DIAGNOSIS — F251 Schizoaffective disorder, depressive type: Secondary | ICD-10-CM

## 2018-12-05 DIAGNOSIS — R4589 Other symptoms and signs involving emotional state: Secondary | ICD-10-CM | POA: Diagnosis present

## 2018-12-05 DIAGNOSIS — Z8249 Family history of ischemic heart disease and other diseases of the circulatory system: Secondary | ICD-10-CM

## 2018-12-05 DIAGNOSIS — E785 Hyperlipidemia, unspecified: Secondary | ICD-10-CM | POA: Diagnosis present

## 2018-12-05 DIAGNOSIS — E876 Hypokalemia: Secondary | ICD-10-CM | POA: Diagnosis present

## 2018-12-05 DIAGNOSIS — M21371 Foot drop, right foot: Secondary | ICD-10-CM | POA: Diagnosis present

## 2018-12-05 LAB — RAPID URINE DRUG SCREEN, HOSP PERFORMED
Amphetamines: NOT DETECTED
Barbiturates: NOT DETECTED
Benzodiazepines: NOT DETECTED
Cocaine: NOT DETECTED
Opiates: NOT DETECTED
Tetrahydrocannabinol: NOT DETECTED

## 2018-12-05 LAB — COMPREHENSIVE METABOLIC PANEL
ALT: 22 U/L (ref 0–44)
AST: 24 U/L (ref 15–41)
Albumin: 5.1 g/dL — ABNORMAL HIGH (ref 3.5–5.0)
Alkaline Phosphatase: 97 U/L (ref 38–126)
Anion gap: 11 (ref 5–15)
BUN: 20 mg/dL (ref 6–20)
CO2: 26 mmol/L (ref 22–32)
Calcium: 10 mg/dL (ref 8.9–10.3)
Chloride: 102 mmol/L (ref 98–111)
Creatinine, Ser: 1.73 mg/dL — ABNORMAL HIGH (ref 0.44–1.00)
GFR calc Af Amer: 37 mL/min — ABNORMAL LOW (ref >60)
GFR, EST NON AFRICAN AMERICAN: 32 mL/min — AB (ref >60)
Glucose, Bld: 85 mg/dL (ref 70–99)
Potassium: 3.6 mmol/L (ref 3.5–5.1)
Sodium: 139 mmol/L (ref 135–145)
Total Bilirubin: 1 mg/dL (ref 0.3–1.2)
Total Protein: 9 g/dL — ABNORMAL HIGH (ref 6.5–8.1)

## 2018-12-05 LAB — CBC WITH DIFFERENTIAL/PLATELET
Abs Immature Granulocytes: 0.04 10*3/uL (ref 0.00–0.07)
Basophils Absolute: 0 10*3/uL (ref 0.0–0.1)
Basophils Relative: 0 %
Eosinophils Absolute: 0 10*3/uL (ref 0.0–0.5)
Eosinophils Relative: 0 %
HCT: 41.8 % (ref 36.0–46.0)
Hemoglobin: 13.1 g/dL (ref 12.0–15.0)
IMMATURE GRANULOCYTES: 0 %
Lymphocytes Relative: 23 %
Lymphs Abs: 3 10*3/uL (ref 0.7–4.0)
MCH: 29.1 pg (ref 26.0–34.0)
MCHC: 31.3 g/dL (ref 30.0–36.0)
MCV: 92.9 fL (ref 80.0–100.0)
Monocytes Absolute: 0.9 10*3/uL (ref 0.1–1.0)
Monocytes Relative: 7 %
NEUTROS ABS: 9 10*3/uL — AB (ref 1.7–7.7)
NEUTROS PCT: 70 %
Platelets: 421 10*3/uL — ABNORMAL HIGH (ref 150–400)
RBC: 4.5 MIL/uL (ref 3.87–5.11)
RDW: 14.1 % (ref 11.5–15.5)
WBC: 13 10*3/uL — ABNORMAL HIGH (ref 4.0–10.5)
nRBC: 0 % (ref 0.0–0.2)

## 2018-12-05 LAB — ETHANOL: Alcohol, Ethyl (B): <10 mg/dL (ref <10)

## 2018-12-05 LAB — ACETAMINOPHEN LEVEL: Acetaminophen (Tylenol), Serum: <10 ug/mL — ABNORMAL LOW (ref 10–30)

## 2018-12-05 LAB — SALICYLATE LEVEL: Salicylate Lvl: <7 mg/dL (ref 2.8–30.0)

## 2018-12-05 LAB — SODIUM, URINE, RANDOM: Sodium, Ur: 69 mmol/L

## 2018-12-05 LAB — CREATININE, URINE, RANDOM: Creatinine, Urine: 266.59 mg/dL

## 2018-12-05 MED ORDER — ONDANSETRON HCL 4 MG/2ML IJ SOLN: 4.0000 mg | Freq: Four times a day (QID) | INTRAMUSCULAR | Status: DC | PRN

## 2018-12-05 MED ORDER — HYDRALAZINE HCL 20 MG/ML IJ SOLN
5.0000 mg | INTRAMUSCULAR | Status: DC | PRN
  Administered 2018-12-08 (×2): 5 mg via INTRAVENOUS
  Filled 2018-12-05: qty 1
  Filled 2018-12-05: qty 0.25
  Filled 2018-12-05: qty 1

## 2018-12-05 MED ORDER — SODIUM CHLORIDE 0.9 % IV BOLUS
1000.0000 mL | Freq: Once | INTRAVENOUS | Status: AC
  Administered 2018-12-05: 1000 mL via INTRAVENOUS

## 2018-12-05 MED ORDER — ONDANSETRON HCL 4 MG PO TABS: 4.0000 mg | ORAL_TABLET | Freq: Four times a day (QID) | ORAL | Status: DC | PRN

## 2018-12-05 MED ORDER — AMLODIPINE BESYLATE 5 MG PO TABS
5.0000 mg | ORAL_TABLET | Freq: Every day | ORAL | Status: DC
  Administered 2018-12-06 (×2): 5 mg via ORAL
  Filled 2018-12-05 (×2): qty 1

## 2018-12-05 MED ORDER — SODIUM CHLORIDE 0.9 % IV BOLUS
1000.0000 mL | Freq: Once | INTRAVENOUS | Status: AC
  Administered 2018-12-06: 1000 mL via INTRAVENOUS

## 2018-12-05 MED ORDER — ENOXAPARIN SODIUM 40 MG/0.4ML ~~LOC~~ SOLN
40.0000 mg | Freq: Every day | SUBCUTANEOUS | Status: DC
  Administered 2018-12-06: 40 mg via SUBCUTANEOUS
  Filled 2018-12-05 (×2): qty 0.4

## 2018-12-05 NOTE — H&P (Signed)
Behavioral Health Medical Screening Exam  EMA SINES is an 58 y.o. female.  Total Time spent with patient: 20 minutes  Psychiatric Specialty Exam: Physical Exam  Nursing note reviewed. Constitutional: She appears well-developed and well-nourished.  Respiratory: Effort normal.  Musculoskeletal: Normal range of motion.  Neurological: She is alert.  Skin: Skin is warm.    Review of Systems  Constitutional: Negative.   HENT: Negative.   Eyes: Negative.   Respiratory: Negative.   Cardiovascular: Negative.   Gastrointestinal: Negative.   Genitourinary: Negative.   Musculoskeletal: Negative.   Skin: Negative.   Neurological: Negative.   Endo/Heme/Allergies: Negative.   Psychiatric/Behavioral:       Delusions, paranoid, disorganized thought    There were no vitals taken for this visit.There is no height or weight on file to calculate BMI.  General Appearance: Casual  Eye Contact:  Minimal  Speech:  Pressured  Volume:  Normal  Mood:  Irritable  Affect:  Flat  Thought Process:  Disorganized and Descriptions of Associations: Circumstantial  Orientation:  Other:  Person  Thought Content:  Delusions and Paranoid Ideation  Suicidal Thoughts:  No  Homicidal Thoughts:  No  Memory:  Immediate;   Poor Recent;   Poor Remote;   Poor  Judgement:  Impaired  Insight:  Lacking  Psychomotor Activity:  Normal  Concentration: Concentration: Poor  Recall:  Poor  Fund of Knowledge:Fair  Language: Fair  Akathisia:  No  Handed:  Right  AIMS (if indicated):     Assets:  Housing Social Support  Sleep:       Musculoskeletal: Strength & Muscle Tone: within normal limits Gait & Station: normal Patient leans: N/A  There were no vitals taken for this visit.  Recommendations:  Based on my evaluation the patient appears to have an emergency medical condition for which I recommend the patient be transferred to the emergency department for further evaluation.  Maryfrances Bunnell,  FNP 12/05/2018, 6:31 PM

## 2018-12-05 NOTE — H&P (Signed)
History and Physical    Andrea Bean YCX:[id number] DOB: [birth year] DOA: 12/05/2018  Referring MD/NP/PA:   PCP: Jackie Plum, MD   Patient coming from:  The patient is coming from home.  At baseline, pt is independent for most of ADL.        Chief Complaint: overdose and suicidal behavior  HPI: Andrea Bean is a 58 y.o. female with medical history significant of hypertension, hyperlipidemia, anxiety, OSA not on CPAP, right foot drop, who presents with overdose and suicidal behavior.  Initially pt reported to ED RN that she took bunch of pills. She changes her story about the time and pills. Initially she said that she is not sure what she took, then later on she said that maybe OTC Benadryl, about 7 pills.  She states that she took the pills either yesterday or day before, but then states that she took it on Thursday. Per RN note, "conversation with husband reveals that pt has not slept for the last 5 days out of fear she going to be arrested for receiving an insurance check from her mothers death some years ago. Pt believes she saw an anouncment on television stating she was being sought for arrest by police for writing or receiving a bad check".  She reports to me that she still fell suicidal, but no homicidal ideations. Patient denies chest pain, shortness breath, nausea, vomiting, diarrhea, abdominal pain, symptoms of UTI or unilateral weakness. Of note, Lipitor, Neurontin and Maxide are on her medication list.  ED Course: pt was found to have WBC 13.0, Tylenol level less than 10, salicylate level less than 7, negative UDS, AKI with creatinine 1.73, BUN 20, temperature normal, heart rate in 90s, oxygen saturation 100% on room air.  QTC 476 on EKG.  patient is placed on telemetry bed for observation.  Review of Systems:   General: no fevers, chills, no body weight gain, has fatigue HEENT: no blurry vision, hearing changes or sore throat Respiratory: no dyspnea, coughing,  wheezing CV: no chest pain, no palpitations GI: no nausea, vomiting, abdominal pain, diarrhea, constipation GU: no dysuria, burning on urination, increased urinary frequency, hematuria  Ext: no leg edema Neuro: no unilateral weakness, numbness, or tingling, no vision change or hearing loss Skin: no rash, no skin tear. MSK: No muscle spasm, no deformity, no limitation of range of movement in spin Heme: No easy bruising.  Psych: has suicidal behavior, not psychotic. Travel history: No recent long distant travel.  Allergy: No Known Allergies  Past Medical History:  Diagnosis Date  . Anxiety   . Hypercholesteremia   . Hypertension   . Right foot drop 01/27/2013    Past Surgical History:  Procedure Laterality Date  . BREAST CYST EXCISION Right   . BREAST SURGERY Right    "duct removal"  . HIP SURGERY Right 1998   3 screws  . PARTIAL HYSTERECTOMY      Social History:  reports that she has never smoked. She has never used smokeless tobacco. She reports that she does not drink alcohol or use drugs.  Family History:  Family History  Problem Relation Age of Onset  . Seizures Mother   . Hypertension Maternal Grandfather   . Diabetes Paternal Grandmother   . Hypertension Paternal Grandfather      Prior to Admission medications   Medication Sig Start Date End Date Taking? Authorizing Provider  atorvastatin (LIPITOR) 40 MG tablet Take 40 mg by mouth daily. 08/06/18  Yes [provider]  triamterene-hydrochlorothiazide (MAXZIDE) 75-50 MG per tablet Take 1 tablet by mouth daily.  10/24/12  Yes [provider]  gabapentin (NEURONTIN) 100 MG capsule Take 1 pill each night for 1 week, the 2 pills each night for 1 week, then 3 pills each night thereafter. Patient not taking: Reported on 12/05/2018 01/27/13   Huston Foley, MD    Physical Exam: Vitals:   12/05/18 1846 12/05/18 2315 12/06/18 0027  BP: (!) 174/111 (!) 167/91 (!) 178/87  Pulse: 93 82 83  Resp: 18 18 18    Temp: 98.2 F (36.8 C) 98.1 F (36.7 C) 98.9 F (37.2 C)  TempSrc: Oral Oral Oral  SpO2: 100% 100% 100%   General: Not in acute distress HEENT:       Eyes: PERRL, EOMI, no scleral icterus.       ENT: No discharge from the ears and nose, no pharynx injection, no tonsillar enlargement.        Neck: No JVD, no bruit, no mass felt. Heme: No neck lymph node enlargement. Cardiac: S1/S2, RRR, No murmurs, No gallops or rubs. Respiratory: No rales, wheezing, rhonchi or rubs. GI: Soft, nondistended, nontender, no rebound pain, no organomegaly, BS present. GU: No hematuria Ext: No pitting leg edema bilaterally. 2+DP/PT pulse bilaterally. Musculoskeletal: No joint deformities, No joint redness or warmth, no limitation of ROM in spin. Skin: No rashes.  Neuro: Alert, oriented X3, cranial nerves II-XII grossly intact, moves all extremities normally.  Psych: pt has suicidal behavior, but no hemocidal ideation.  Labs on Admission: I have personally reviewed following labs and imaging studies  CBC: Recent Labs  Lab 12/05/18 2055 12/06/18 0048  WBC 13.0* 11.4*  NEUTROABS 9.0*  --   HGB 13.1 12.0  HCT 41.8 39.0  MCV 92.9 94.4  PLT 421* 416*   Basic Metabolic Panel: Recent Labs  Lab 12/05/18 2055 12/06/18 0048  NA 139 139  K 3.6 3.4*  CL 102 103  CO2 26 20*  GLUCOSE 85 80  BUN 20 18  CREATININE 1.73* 1.45*  CALCIUM 10.0 9.0   GFR: CrCl cannot be calculated (Unknown ideal weight.). Liver Function Tests: Recent Labs  Lab 12/05/18 2055 12/06/18 0048  AST 24 21  ALT 22 20  ALKPHOS 97 90  BILITOT 1.0 0.7  PROT 9.0* 8.2*  ALBUMIN 5.1* 4.3   No results for input(s): LIPASE, AMYLASE in the last 168 hours. No results for input(s): AMMONIA in the last 168 hours. Coagulation Profile: No results for input(s): INR, PROTIME in the last 168 hours. Cardiac Enzymes: No results for input(s): CKTOTAL, CKMB, CKMBINDEX, TROPONINI in the last 168 hours. BNP (last 3 results) No results  for input(s): PROBNP in the last 8760 hours. HbA1C: No results for input(s): HGBA1C in the last 72 hours. CBG: No results for input(s): GLUCAP in the last 168 hours. Lipid Profile: No results for input(s): CHOL, HDL, LDLCALC, TRIG, CHOLHDL, LDLDIRECT in the last 72 hours. Thyroid Function Tests: No results for input(s): TSH, T4TOTAL, FREET4, T3FREE, THYROIDAB in the last 72 hours. Anemia Panel: No results for input(s): VITAMINB12, FOLATE, FERRITIN, TIBC, IRON, RETICCTPCT in the last 72 hours. Urine analysis:    Component Value Date/Time   COLORURINE STRAW (A) 03/17/2009 2310   APPEARANCEUR CLEAR 03/17/2009 2310   LABSPEC <1.005 (L) 03/17/2009 2310   PHURINE 5.5 03/17/2009 2310   GLUCOSEU NEGATIVE 03/17/2009 2310   HGBUR NEGATIVE 03/17/2009 2310   BILIRUBINUR NEGATIVE 03/17/2009 2310   KETONESUR NEGATIVE 03/17/2009 2310   PROTEINUR NEGATIVE 03/17/2009 2310  UROBILINOGEN 0.2 03/17/2009 2310   NITRITE NEGATIVE 03/17/2009 2310   LEUKOCYTESUR  03/17/2009 2310    NEGATIVE MICROSCOPIC NOT DONE ON URINES WITH NEGATIVE PROTEIN, BLOOD, LEUKOCYTES, NITRITE, OR GLUCOSE <1000 mg/dL.   Sepsis Labs: (procalcitonin:4,lacticidven:4) )No results found for this or any previous visit (from the past 240 hour(s)).   Radiological Exams on Admission: No results found.   EKG: Independently reviewed.  Sinus rhythm, QTC 476, early R wave progression, no ischemic ST or T wave change.  Assessment/Plan Principal Problem:   Overdose Active Problems:   Hypercholesteremia   Hypertension   Suicidal behavior   AKI (acute kidney injury) (HCC)   Leukocytosis   Overdose and suicidal behavior : Not sure exactly what patient overdosed and when she took pills.  Tylenol level less than 10, repeated Tylenol level is still less than 10.  Salicylate level less than 7.  QTc 476.  Hemodynamically stable.  No confusion.  Patient still has suicidal ideation.  -Placed on telemetry bed for  observation -Hold home medications, including Maxide, Lipitor and Neurontin -Sitter at bedside with suicidal precaution -Psych consult is requested via epic  Hypercholesteremia: -hold Lipitor since not sure if patient lower dose of Lipitor  Hypertension: Blood pressure 174/111 -Hold home Maxide -Start amlodipine -IV hydralazine as needed  AKI: Likely due to dehydration and continuation of diuretics - IVF: 2L NS, then 100 cc/h - Follow up renal function by BMP - Check FeUrea - Hold Maxzide  Leukocytosis: No fever, does not seem to have infection.  Possibly due to stress.  No respiratory symptoms. -Follow-up urinalysis.   DVT ppx: SQ Lovenox Code Status: Full code Family Communication: None at bed side. Disposition Plan:  Anticipate discharge back to previous home environment Consults called:  none Admission status: Obs / tele     Date of Service 12/06/2018    Lorretta Harp [facility]   If 7PM-7AM, please contact night-coverage [email or web address] Password [email or web address] 12/06/2018, 4:40 AM

## 2018-12-05 NOTE — ED Triage Notes (Signed)
Pt reports "I did something bad to get myself in trouble so I tried killing myself by taking a bunch of pills".  When asked patient when she took the pills she thinks either yesterday or day before.  Pt denies taking any medications today.  Pt states, " I cashed some checks and I know I am going to jail".

## 2018-12-05 NOTE — BHH Counselor (Signed)
Pt was assessed at [facility], another TTS consult is not needed. TTS to seek placement.    Redmond Pulling, MS, [facility], [facility] Triage Specialist [phone]

## 2018-12-05 NOTE — BH Assessment (Signed)
Assessment Note  Andrea Bean is an 58 y.o. female who presented to [facility] with her husband,  Lorissa Kishbaugh [phone], because patient has not been sleeping for the past five days and has become very paranoid, delusional and suicidal.  Patient took 70 unknown pills last night in a suicide attempt.  According to patient's husband, patient has a history of bipolar disorder and has been off her medications for the past ten years.  Husband states that that her mental health issues began in 84 when her son died from cancer, he was 46 years old. Husband states that patient has recently began thinking that she is going to be arrested and that the FBI is after her.  She feels like the house next door was used for surveillance because she has done something that she is going to be arrested for. Patient states, "I know I am going to jail, but I don't know why.  I did something, but I cannot remember what."  Husband states that patient has been pacing all night long looking out the blinds and freaking out when she hears a siren. Patient has never been psychotic in the past.  Husband states that he feels like his recent heart attack has thrown her off.  Patient admits that she feels like she is going to arrested and she admits to hearing voices, non command in nature.  Patient denies any SA use.  She denies any history of abuse of self-mutilation.  Husband states that patient has seen a psychiatrist in the past, Dr Andrey Campanile and Dr. Evelene Croon.  Her husband states that patient she has an appointment in April with Dr. Evelene Croon.  Patient lives with her husband and they have been married for forty years  They had three children prior to her son's death.  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 Husband states that they are currently struggling financially.  Patient presented as alert and oriented, but admits that she has been struggling with auditory hallucinations and delusions.  Her  mood is depressed and her affect is flat/bluted.  Her thoughts are disorganized, but her memory is intact.  Her judgment, insight and impulse control were impaired.  Her eye contact was not good, but her speech was clear and coherent.  Her psycho-motor activity was relatively normal.  Diagnosis: F31.2 Bipolar Disorder Manic/Psychotic  Past Medical History:  Past Medical History:  Diagnosis Date  . Anxiety   . Hypercholesteremia   . Hypertension   . Right foot drop 01/27/2013    Past Surgical History:  Procedure Laterality Date  . BREAST CYST EXCISION Right   . BREAST SURGERY Right    "duct removal"  . HIP SURGERY Right 1998   3 screws  . PARTIAL HYSTERECTOMY      Family History:  Family History  Problem Relation Age of Onset  . Seizures Mother   . Hypertension Maternal Grandfather   . Diabetes Paternal Grandmother   . Hypertension Paternal Grandfather     Social History:  reports that she has never smoked. She does not have any smokeless tobacco history on file. She reports that she does not drink alcohol or use drugs.  Additional Social History:  Alcohol / Drug Use Pain Medications: see MAR Prescriptions: see MAR Over the Counter: see MAR History of alcohol / drug use?: No history of alcohol / drug abuse Longest period of sobriety (when/how long): NA  CIWA:   COWS:    Allergies: No  Known Allergies  Home Medications: (Not in a hospital admission)   OB/GYN Status:  No LMP recorded.  General Assessment Data Location of Assessment: [facility] Assessment Services TTS Assessment: In system Is this a Tele or Face-to-Face Assessment?: Face-to-Face Is this an Initial Assessment or a Re-assessment for this encounter?: Initial Assessment Patient Accompanied by:: Other(husband) Language Other than English: No Living Arrangements: Other (Comment)(lives with husband) What gender do you identify as?: Female Marital status: Married Living Arrangements: Spouse/significant  other Can pt return to current living arrangement?: Yes Admission Status: Voluntary Is patient capable of signing voluntary admission?: Yes Referral Source: Self/Family/Friend Insurance type: Land Exam [facility] Walk-in ONLY) Medical Exam completed: Yes  Crisis Care Plan Living Arrangements: Spouse/significant other Legal Guardian: Other:(self) Name of Psychiatrist: none Name of Therapist: none  Education Status Is patient currently in school?: No Is the patient employed, unemployed or receiving disability?: Unemployed, Receiving disability income  Risk to self with the past 6 months Suicidal Ideation: Yes-Currently Present Has patient been a risk to self within the past 6 months prior to admission? : No Suicidal Intent: Yes-Currently Present Has patient had any suicidal intent within the past 6 months prior to admission? : No Is patient at risk for suicide?: Yes Suicidal Plan?: Yes-Currently Present Has patient had any suicidal plan within the past 6 months prior to admission? : No Specify Current Suicidal Plan: took 18 pills in an overdose attempt Access to Means: Yes Specify Access to Suicidal Means: home medications What has been your use of drugs/alcohol within the last 12 months?: none Previous Attempts/Gestures: No How many times?: 0 Other Self Harm Risks: patient is delusional Triggers for Past Attempts: None known Intentional Self Injurious Behavior: None Family Suicide History: No Recent stressful life event(s): Financial Problems, Other (Comment)(son's death and husband's heart attack) Persecutory voices/beliefs?: No Depression: Yes Depression Symptoms: Despondent, Insomnia, Guilt, Loss of interest in usual pleasures Substance abuse history and/or treatment for substance abuse?: No Suicide prevention information given to non-admitted patients: Not applicable  Risk to Others within the past 6 months Homicidal Ideation: No Does patient have any  lifetime risk of violence toward others beyond the six months prior to admission? : No Thoughts of Harm to Others: No Current Homicidal Intent: No Current Homicidal Plan: No Access to Homicidal Means: No Identified Victim: none History of harm to others?: No Assessment of Violence: None Noted Violent Behavior Description: none Does patient have access to weapons?: No Criminal Charges Pending?: No Does patient have a court date: No Is patient on probation?: No  Psychosis Hallucinations: Auditory, Visual Delusions: (paranoid delusions)  Mental Status Report Appearance/Hygiene: Disheveled Eye Contact: Poor Motor Activity: Freedom of movement Speech: Logical/coherent Level of Consciousness: Alert Mood: Depressed, Anxious Affect: Blunted, Flat Anxiety Level: Minimal Thought Processes: Coherent, Relevant Judgement: Impaired Orientation: Person, Place, Time, Situation Obsessive Compulsive Thoughts/Behaviors: None  Cognitive Functioning Concentration: Decreased Memory: Recent Intact, Remote Intact Is patient IDD: No Insight: Poor Impulse Control: Poor Appetite: Good Have you had any weight changes? : No Change Sleep: Decreased Total Hours of Sleep: (no sleep in five days) Vegetative Symptoms: Decreased grooming  ADLScreening [facility] Assessment Services) Patient's cognitive ability adequate to safely complete daily activities?: Yes Patient able to express need for assistance with ADLs?: Yes Independently performs ADLs?: Yes (appropriate for developmental age)  Prior Inpatient Therapy Prior Inpatient Therapy: No  Prior Outpatient Therapy Prior Outpatient Therapy: No Does patient have an ACCT team?: No Does patient have Intensive In-House Services?  :  No Does patient have [facility] services? : No Does patient have P4CC services?: No  ADL Screening (condition at time of admission) Patient's cognitive ability adequate to safely complete daily activities?: Yes Is the patient  deaf or have difficulty hearing?: No Does the patient have difficulty seeing, even when wearing glasses/contacts?: No Does the patient have difficulty concentrating, remembering, or making decisions?: No Patient able to express need for assistance with ADLs?: Yes Does the patient have difficulty dressing or bathing?: No Independently performs ADLs?: Yes (appropriate for developmental age) Does the patient have difficulty walking or climbing stairs?: No Weakness of Legs: None Weakness of Arms/Hands: None  Home Assistive Devices/Equipment Home Assistive Devices/Equipment: None  Therapy Consults (therapy consults require a physician order) PT Evaluation Needed: No OT Evalulation Needed: No SLP Evaluation Needed: No Abuse/Neglect Assessment (Assessment to be complete while patient is alone) Abuse/Neglect Assessment Can Be Completed: Yes Physical Abuse: Denies Verbal Abuse: Denies Sexual Abuse: Denies Exploitation of patient/patient's resources: Denies Self-Neglect: Denies Values / Beliefs Cultural Requests During Hospitalization: None Spiritual Requests During Hospitalization: None Consults Spiritual Care Consult Needed: No Social Work Consult Needed: No Merchant navy officer (For Healthcare) Does Patient Have a Medical Advance Directive?: No Would patient like information on creating a medical advance directive?: No - Patient declined Nutrition Screen- [facility] Adult/WL/AP Has the patient recently lost weight without trying?: No Has the patient been eating poorly because of a decreased appetite?: No Malnutrition Screening Tool Score: 0        Disposition: Per Reola Calkins, NP Patient meets inpatient admission criteria, but will need to be medically cleared for her overdose prior to being admitted to [facility]  Disposition Initial Assessment Completed for this Encounter: Yes Disposition of Patient: Admit Type of inpatient treatment program: Adult  On Site Evaluation by:   Reviewed with  Physician:    Arnoldo Lenis Nyx Keady 12/05/2018 6:46 PM

## 2018-12-05 NOTE — ED Notes (Addendum)
Pt requesting to be allowed to cut her hair because when she goes to jail she doesn't want to be embarrassed by her patchy spot of hair stated: "I would rather be bald headed than be embarrassed by my patchy hair I got bald spots". Conversation with husband reveals that pt has not slept for the last 5 days out of fear she going to be arrested for receiving an insurance check from her mothers death some years ago. Pt believes she saw an anouncment on television stating she was being sought for arrest by police for writing or receiving a bad check.

## 2018-12-05 NOTE — ED Notes (Signed)
Bed: ZH08 Expected date:  Expected time:  Means of arrival:  Comments: FROM [facility]

## 2018-12-05 NOTE — ED Provider Notes (Signed)
[facility]-EMERGENCY DEPT Provider Note   CSN: [id number] Arrival date & time: 12/05/18  1836    History   Chief Complaint Chief Complaint  Patient presents with  . Suicidal    HPI Andrea Bean is a 58 y.o. female.     HPI   58 year old female with history of anxiety, hypertension, hyperlipidemia comes in a chief complaint of suicidal ideation. Patient is not providing any meaningful history.  She reports that she was watching the news and found out that she will be arrested and therefore she decided to kill herself by overdosing.  Patient is unable to tell me what medicine she took.  She states that she popped about 50 pills and then fell asleep.  Her husband came home and found her to be confused and brought her to the ER.  Patient denies any history of depression or mental illness. She is unsure what time she ingested the pills.  She thinks that she took the pills around 4:56 PM when the news was on.  Past Medical History:  Diagnosis Date  . Anxiety   . Hypercholesteremia   . Hypertension   . Right foot drop 01/27/2013    Patient Active Problem List   Diagnosis Date Noted  . Suicidal behavior 12/05/2018  . Overdose 12/05/2018  . AKI (acute kidney injury) (HCC) 12/05/2018  . Leukocytosis 12/05/2018  . Hypercholesteremia   . Hypertension   . OSA (obstructive sleep apnea) 06/22/2013  . Right foot drop 01/27/2013    Past Surgical History:  Procedure Laterality Date  . BREAST CYST EXCISION Right   . BREAST SURGERY Right    "duct removal"  . HIP SURGERY Right 1998   3 screws  . PARTIAL HYSTERECTOMY       OB History   No obstetric history on file.      Home Medications    Prior to Admission medications   Medication Sig Start Date End Date Taking? Authorizing Provider  atorvastatin (LIPITOR) 40 MG tablet Take 40 mg by mouth daily. 08/06/18  Yes [provider]  triamterene-hydrochlorothiazide (MAXZIDE) 75-50 MG per tablet  Take 1 tablet by mouth daily.  10/24/12  Yes [provider]  gabapentin (NEURONTIN) 100 MG capsule Take 1 pill each night for 1 week, the 2 pills each night for 1 week, then 3 pills each night thereafter. Patient not taking: Reported on 12/05/2018 01/27/13   Huston Foley, MD    Family History Family History  Problem Relation Age of Onset  . Seizures Mother   . Hypertension Maternal Grandfather   . Diabetes Paternal Grandmother   . Hypertension Paternal Grandfather     Social History Social History   Tobacco Use  . Smoking status: Never Smoker  . Smokeless tobacco: Never Used  Substance Use Topics  . Alcohol use: No  . Drug use: No     Allergies   Patient has no known allergies.   Review of Systems Review of Systems  Constitutional: Positive for activity change.  Respiratory: Negative for shortness of breath.   Cardiovascular: Negative for chest pain.  Gastrointestinal: Negative for nausea and vomiting.  Psychiatric/Behavioral: Positive for suicidal ideas.  All other systems reviewed and are negative.    Physical Exam Updated Vital Signs BP (!) 167/91 (BP Location: Left Arm)   Pulse 82   Temp 98.1 F (36.7 C) (Oral)   Resp 18   SpO2 100%   Physical Exam Vitals signs and nursing note reviewed.  Constitutional:  Appearance: She is well-developed.  HENT:     Head: Normocephalic and atraumatic.  Neck:     Musculoskeletal: Normal range of motion and neck supple.  Cardiovascular:     Rate and Rhythm: Normal rate.  Pulmonary:     Effort: Pulmonary effort is normal.  Abdominal:     General: Bowel sounds are normal.  Skin:    General: Skin is warm and dry.  Neurological:     Mental Status: She is alert and oriented to person, place, and time.  Psychiatric:        Mood and Affect: Mood normal.        Thought Content: Thought content normal.     Comments: Abnormal judgment      ED Treatments / Results  Labs (all labs ordered are listed, but  only abnormal results are displayed) Labs Reviewed  COMPREHENSIVE METABOLIC PANEL - Abnormal; Notable for the following components:      Result Value   Creatinine, Ser 1.73 (*)    Total Protein 9.0 (*)    Albumin 5.1 (*)    GFR calc non Af Amer 32 (*)    GFR calc Af Amer 37 (*)    All other components within normal limits  ACETAMINOPHEN LEVEL - Abnormal; Notable for the following components:   Acetaminophen (Tylenol), Serum <10 (*)    All other components within normal limits  CBC WITH DIFFERENTIAL/PLATELET - Abnormal; Notable for the following components:   WBC 13.0 (*)    Platelets 421 (*)    Neutro Abs 9.0 (*)    All other components within normal limits  SALICYLATE LEVEL  ETHANOL  RAPID URINE DRUG SCREEN, HOSP PERFORMED  SODIUM, URINE, RANDOM  CREATININE, URINE, RANDOM  UREA NITROGEN, URINE  HEPATIC FUNCTION PANEL  ACETAMINOPHEN LEVEL  HIV ANTIBODY (ROUTINE TESTING W REFLEX)  BASIC METABOLIC PANEL  CBC  CBG MONITORING, ED    EKG EKG Interpretation  Date/Time:  Friday December 05 2018 19:31:10 EST Ventricular Rate:  86 PR Interval:  166 QRS Duration: 80 QT Interval:  398 QTC Calculation: 476 R Axis:   59 Text Interpretation:  Normal sinus rhythm Normal ECG No acute changes No significant change since last tracing Confirmed by Derwood Kaplan ([id number]) on 12/05/2018 8:59:05 PM   Radiology No results found.  Procedures Procedures (including critical care time)  Medications Ordered in ED Medications  sodium chloride 0.9 % bolus 1,000 mL (has no administration in time range)  hydrALAZINE (APRESOLINE) injection 5 mg (has no administration in time range)  amLODipine (NORVASC) tablet 5 mg (has no administration in time range)  enoxaparin (LOVENOX) injection 40 mg (has no administration in time range)  ondansetron (ZOFRAN) tablet 4 mg (has no administration in time range)    Or  ondansetron (ZOFRAN) injection 4 mg (has no administration in time range)  sodium chloride  0.9 % bolus 1,000 mL (0 mLs Intravenous Stopped 12/05/18 2347)     Initial Impression / Assessment and Plan / ED Course  I have reviewed the triage vital signs and the nursing notes.  Pertinent labs & imaging results that were available during my care of the patient were reviewed by me and considered in my medical decision making (see chart for details).        58 year old female comes in a chief complaint of suicidal ideation. Patient admits to overdosing prior to ED arrival because she found on the news that she was going to be arrested. Unfortunately she is not providing any  meaningful history.  She is unsure what medication she took, how much of the medication she took and at what time she took the medication.  It does appear grossly clear that patient took her medication more than an hour ago now and she is not a candidate for charcoal.  Currently patient appears appropriate.  She denies any confusion, weakness, focal neurologic symptoms, palpitations.  Hemodynamically she is stable.  EKG looks fine.  Plan is to get basic labs and continue to monitor her closely.  To be on the safe side we will likely monitor her for 6 hours before we will medically clear her.  She is not on any psychiatric medication.  Final Clinical Impressions(s) / ED Diagnoses   Final diagnoses:  AKI (acute kidney injury) [facility])  Suicidal ideation    ED Discharge Orders    None       Derwood Kaplan, MD 12/06/18 0005

## 2018-12-06 ENCOUNTER — Encounter (HOSPITAL_COMMUNITY): Payer: Self-pay | Admitting: *Deleted

## 2018-12-06 DIAGNOSIS — T50992A Poisoning by other drugs, medicaments and biological substances, intentional self-harm, initial encounter: Secondary | ICD-10-CM

## 2018-12-06 DIAGNOSIS — Z79899 Other long term (current) drug therapy: Secondary | ICD-10-CM | POA: Diagnosis not present

## 2018-12-06 DIAGNOSIS — F251 Schizoaffective disorder, depressive type: Secondary | ICD-10-CM | POA: Diagnosis present

## 2018-12-06 DIAGNOSIS — D72829 Elevated white blood cell count, unspecified: Secondary | ICD-10-CM | POA: Diagnosis present

## 2018-12-06 DIAGNOSIS — R45851 Suicidal ideations: Secondary | ICD-10-CM | POA: Diagnosis not present

## 2018-12-06 DIAGNOSIS — I10 Essential (primary) hypertension: Secondary | ICD-10-CM | POA: Diagnosis present

## 2018-12-06 DIAGNOSIS — N179 Acute kidney failure, unspecified: Secondary | ICD-10-CM | POA: Diagnosis present

## 2018-12-06 DIAGNOSIS — T50902A Poisoning by unspecified drugs, medicaments and biological substances, intentional self-harm, initial encounter: Secondary | ICD-10-CM | POA: Diagnosis not present

## 2018-12-06 DIAGNOSIS — E876 Hypokalemia: Secondary | ICD-10-CM | POA: Diagnosis present

## 2018-12-06 DIAGNOSIS — F259 Schizoaffective disorder, unspecified: Secondary | ICD-10-CM | POA: Diagnosis not present

## 2018-12-06 DIAGNOSIS — Z8249 Family history of ischemic heart disease and other diseases of the circulatory system: Secondary | ICD-10-CM | POA: Diagnosis not present

## 2018-12-06 DIAGNOSIS — Z90711 Acquired absence of uterus with remaining cervical stump: Secondary | ICD-10-CM | POA: Diagnosis not present

## 2018-12-06 DIAGNOSIS — M21371 Foot drop, right foot: Secondary | ICD-10-CM | POA: Diagnosis present

## 2018-12-06 DIAGNOSIS — E860 Dehydration: Secondary | ICD-10-CM | POA: Diagnosis present

## 2018-12-06 DIAGNOSIS — F319 Bipolar disorder, unspecified: Secondary | ICD-10-CM | POA: Diagnosis present

## 2018-12-06 DIAGNOSIS — F419 Anxiety disorder, unspecified: Secondary | ICD-10-CM | POA: Diagnosis present

## 2018-12-06 DIAGNOSIS — E785 Hyperlipidemia, unspecified: Secondary | ICD-10-CM | POA: Diagnosis present

## 2018-12-06 DIAGNOSIS — Y92009 Unspecified place in unspecified non-institutional (private) residence as the place of occurrence of the external cause: Secondary | ICD-10-CM | POA: Diagnosis not present

## 2018-12-06 LAB — HEPATIC FUNCTION PANEL
ALT: 20 U/L (ref 0–44)
AST: 21 U/L (ref 15–41)
Albumin: 4.3 g/dL (ref 3.5–5.0)
Alkaline Phosphatase: 90 U/L (ref 38–126)
Bilirubin, Direct: 0.2 mg/dL (ref 0.0–0.2)
Indirect Bilirubin: 0.5 mg/dL (ref 0.3–0.9)
Total Bilirubin: 0.7 mg/dL (ref 0.3–1.2)
Total Protein: 8.2 g/dL — ABNORMAL HIGH (ref 6.5–8.1)

## 2018-12-06 LAB — CBC
HEMATOCRIT: 39 % (ref 36.0–46.0)
Hemoglobin: 12 g/dL (ref 12.0–15.0)
MCH: 29.1 pg (ref 26.0–34.0)
MCHC: 30.8 g/dL (ref 30.0–36.0)
MCV: 94.4 fL (ref 80.0–100.0)
Platelets: 416 10*3/uL — ABNORMAL HIGH (ref 150–400)
RBC: 4.13 MIL/uL (ref 3.87–5.11)
RDW: 14.2 % (ref 11.5–15.5)
WBC: 11.4 10*3/uL — ABNORMAL HIGH (ref 4.0–10.5)
nRBC: 0 % (ref 0.0–0.2)

## 2018-12-06 LAB — BASIC METABOLIC PANEL
Anion gap: 16 — ABNORMAL HIGH (ref 5–15)
BUN: 18 mg/dL (ref 6–20)
CO2: 20 mmol/L — ABNORMAL LOW (ref 22–32)
Calcium: 9 mg/dL (ref 8.9–10.3)
Chloride: 103 mmol/L (ref 98–111)
Creatinine, Ser: 1.45 mg/dL — ABNORMAL HIGH (ref 0.44–1.00)
GFR calc Af Amer: 45 mL/min — ABNORMAL LOW (ref >60)
GFR calc non Af Amer: 39 mL/min — ABNORMAL LOW (ref >60)
Glucose, Bld: 80 mg/dL (ref 70–99)
Potassium: 3.4 mmol/L — ABNORMAL LOW (ref 3.5–5.1)
Sodium: 139 mmol/L (ref 135–145)

## 2018-12-06 LAB — ACETAMINOPHEN LEVEL: Acetaminophen (Tylenol), Serum: <10 ug/mL — ABNORMAL LOW (ref 10–30)

## 2018-12-06 LAB — HIV ANTIBODY (ROUTINE TESTING W REFLEX): HIV Screen 4th Generation wRfx: NONREACTIVE

## 2018-12-06 MED ORDER — HYDROXYZINE HCL 10 MG PO TABS
10.0000 mg | ORAL_TABLET | Freq: Two times a day (BID) | ORAL | Status: DC
  Administered 2018-12-06 – 2018-12-08 (×5): 10 mg via ORAL
  Filled 2018-12-06 (×5): qty 1

## 2018-12-06 MED ORDER — LORAZEPAM 2 MG/ML IJ SOLN
2.0000 mg | Freq: Once | INTRAMUSCULAR | Status: AC
  Administered 2018-12-06: 2 mg via INTRAVENOUS
  Filled 2018-12-06: qty 1

## 2018-12-06 MED ORDER — ZIPRASIDONE HCL 40 MG PO CAPS
40.0000 mg | ORAL_CAPSULE | Freq: Two times a day (BID) | ORAL | Status: DC
  Administered 2018-12-06 – 2018-12-08 (×4): 40 mg via ORAL
  Filled 2018-12-06 (×4): qty 1

## 2018-12-06 MED ORDER — POTASSIUM CHLORIDE CRYS ER 20 MEQ PO TBCR
40.0000 meq | EXTENDED_RELEASE_TABLET | Freq: Once | ORAL | Status: AC
  Administered 2018-12-06: 40 meq via ORAL
  Filled 2018-12-06: qty 2

## 2018-12-06 MED ORDER — MAGNESIUM SULFATE 2 GM/50ML IV SOLN
2.0000 g | Freq: Once | INTRAVENOUS | Status: AC
  Administered 2018-12-06: 2 g via INTRAVENOUS
  Filled 2018-12-06: qty 50

## 2018-12-06 MED ORDER — SODIUM CHLORIDE 0.9 % IV SOLN
INTRAVENOUS | Status: DC
  Administered 2018-12-06 – 2018-12-07 (×3): via INTRAVENOUS

## 2018-12-06 MED ORDER — ACETAMINOPHEN 325 MG PO TABS
650.0000 mg | ORAL_TABLET | Freq: Four times a day (QID) | ORAL | Status: DC | PRN
  Administered 2018-12-06: 650 mg via ORAL
  Filled 2018-12-06: qty 2

## 2018-12-06 NOTE — Progress Notes (Signed)
PROGRESS NOTE  Andrea Bean YQI:[id number] DOB: 1961/07/27 DOA: 12/05/2018 PCP: Jackie Plum, MD  HPI/Recap of past 24 hours: Andrea Bean is a 58 y.o. female with medical history significant of hypertension, hyperlipidemia, anxiety, OSA not on CPAP, right foot Bean, who presents with overdose and suicidal behavior.  On presentation, WBC 13.0, Tylenol level less than 10, salicylate level less than 7, negative UDS, AKI with creatinine 1.73, BUN 20, temperature normal, heart rate in 90s, oxygen saturation 100% on room air.  QTC 476 on EKG.  patient is placed on telemetry bed for observation.  12/06/18: Patient seen and examined. Has a one-to-one sitter at bedside for safety.  Still feels suicidal and states she "wants to be dead and could not even succeed at it".  States she has been depressed all her life as far she can remember.  Denies any homicidal ideation.  Denies any chest pain, dyspnea, abdominal pain, nausea, dysuria.  She has no physical complaints.  Assessment/Plan: Principal Problem:   Overdose Active Problems:   Hypercholesteremia   Hypertension   Suicidal behavior   AKI (acute kidney injury) (HCC)   Leukocytosis  Overdose with suicidal attempt Unclear what she overdosed on Tylenol level less than 10, repeated Tylenol level is still less than 10.  Salicylate level less than 7.  QTc 476.  Hemodynamically stable.  No confusion.  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  Would be an unsafe discharge to home environment at this point. Continue close monitoring  AKI, suspect prerenal from dehydration Presented with creatinine of 1.73 Unclear what her baseline is Creatinine is trending down to 1.45 Continue to  avoid nephrotoxic agents/dehydration/hypotension Continue gentle IV fluid hydration Monitor urine output Repeat BMP in the morning  Leukocytosis, unclear etiology Suspect reactive Patient has no apparent physical complaints WBC trending down without any antibiotics from 13 K to 11 K Afebrile and does not appear septic  Mild hypokalemia Potassium 3.4 Repleted with oral KCl and 2 g of IV magnesium Repeat labs in the morning  Hyperlipidemia Resume Lipitor  Accelerated hypertension, improving Continue amlodipine  Risks: High risk for death or decompensation due to ongoing suicidal ideation with initial failed attempt.  Patient will require at least 2 midnights for psychiatric evaluation and possible placement to a psych Institute for her own safety.   DVT ppx: SQ Lovenox daily Code Status: Full code Family Communication: None at bed side. Disposition Plan: Possibly admission to psych ward.  Unsafe discharge due to ongoing suicidal ideation. Consults called:   Psych consult on epic     Objective: Vitals:   12/05/18 2315 12/06/18 0027 12/06/18 0502 12/06/18 0725  BP: (!) 167/91 (!) 178/87 (!) 164/81 (!) 148/80  Pulse: 82 83 85 85  Resp: 18 18 16 16   Temp: 98.1 F (36.7 C) 98.9 F (37.2 C) 98.4 F (36.9 C) 98.4 F (36.9 C)  TempSrc: Oral Oral Oral Oral  SpO2: 100% 100% 100%   Weight:    90.1 kg  Height:    5' 5.51" (1.664 m)    Intake/Output Summary (Last 24 hours) at 12/06/2018 0826 Last data filed at 12/06/2018 3875 Gross per 24 hour  Intake 1335 ml  Output -  Net 1335 ml   [employer]  12/06/18 0725  Weight: 90.1 kg    Exam:  . General: 58 y.o. year-old female well developed well nourished in no acute distress.  Alert and interactive. . Cardiovascular: Regular rate and rhythm with no rubs or gallops.  No thyromegaly or JVD noted.   Marland Kitchen Respiratory: Clear to auscultation with no wheezes or rales. Good inspiratory effort. . Abdomen: Soft nontender  nondistended with normal bowel sounds x4 quadrants. . Musculoskeletal: No lower extremity edema. 2/4 pulses in all 4 extremities. Marland Kitchen Psychiatry: She becomes teary when she talks about her depression.  Depressed mood.   Data Reviewed: CBC: Recent Labs  Lab 12/05/18 2055 12/06/18 0048  WBC 13.0* 11.4*  NEUTROABS 9.0*  --   HGB 13.1 12.0  HCT 41.8 39.0  MCV 92.9 94.4  PLT 421* 416*   Basic Metabolic Panel: Recent Labs  Lab 12/05/18 2055 12/06/18 0048  NA 139 139  K 3.6 3.4*  CL 102 103  CO2 26 20*  GLUCOSE 85 80  BUN 20 18  CREATININE 1.73* 1.45*  CALCIUM 10.0 9.0   GFR: Estimated Creatinine Clearance: 46.2 mL/min (A) (by C-G formula based on SCr of 1.45 mg/dL (H)). Liver Function Tests: Recent Labs  Lab 12/05/18 2055 12/06/18 0048  AST 24 21  ALT 22 20  ALKPHOS 97 90  BILITOT 1.0 0.7  PROT 9.0* 8.2*  ALBUMIN 5.1* 4.3   No results for input(s): LIPASE, AMYLASE in the last 168 hours. No results for input(s): AMMONIA in the last 168 hours. Coagulation Profile: No results for input(s): INR, PROTIME in the last 168 hours. Cardiac Enzymes: No results for input(s): CKTOTAL, CKMB, CKMBINDEX, TROPONINI in the last 168 hours. BNP (last 3 results) No results for input(s): PROBNP in the last 8760 hours. HbA1C: No results for input(s): HGBA1C in the last 72 hours. CBG: No results for input(s): GLUCAP in the last 168 hours. Lipid Profile: No results for input(s): CHOL, HDL, LDLCALC, TRIG, CHOLHDL, LDLDIRECT in the last 72 hours. Thyroid Function Tests: No results for input(s): TSH, T4TOTAL, FREET4, T3FREE, THYROIDAB in the last 72 hours. Anemia Panel: No results for input(s): VITAMINB12, FOLATE, FERRITIN, TIBC, IRON, RETICCTPCT in the last 72 hours. Urine analysis:    Component Value Date/Time   COLORURINE STRAW (A) 03/17/2009 2310   APPEARANCEUR CLEAR 03/17/2009 2310   LABSPEC <1.005 (L) 03/17/2009 2310   PHURINE 5.5 03/17/2009 2310   GLUCOSEU NEGATIVE  03/17/2009 2310   HGBUR NEGATIVE 03/17/2009 2310   BILIRUBINUR NEGATIVE 03/17/2009 2310   KETONESUR NEGATIVE 03/17/2009 2310   PROTEINUR NEGATIVE 03/17/2009 2310   UROBILINOGEN 0.2 03/17/2009 2310   NITRITE NEGATIVE 03/17/2009 2310   LEUKOCYTESUR  03/17/2009 2310    NEGATIVE MICROSCOPIC NOT DONE ON URINES WITH NEGATIVE PROTEIN, BLOOD, LEUKOCYTES, NITRITE, OR GLUCOSE <1000 mg/dL.   Sepsis Labs: @LABRCNTIP (procalcitonin:4,lacticidven:4)  )No results found for this or any previous visit (from the past 240 hour(s)).    Studies: No results found.  Scheduled Meds: . amLODipine  5 mg Oral Daily  . enoxaparin (LOVENOX) injection  40 mg Subcutaneous Daily    Continuous Infusions: . sodium chloride 100 mL/hr at 12/06/18 0525     LOS: 0 days     Andrea Drop, MD [facility] Pager [phone]  If 7PM-7AM, please contact night-coverage [email or web address] Password [facility] 12/06/2018, 8:26 AM

## 2018-12-06 NOTE — ED Notes (Signed)
ED TO INPATIENT HANDOFF REPORT  ED Nurse Name and Phone #: [phone]  S Name/Age/Gender Andrea Bean 58 y.o. female Room/Bed: WA28/WA28  Code Status   Code Status: Full Code  Home/SNF/Other Home Patient oriented to: self Is this baseline? Yes     Chief Complaint Mental Evaluation   Triage Note Pt reports "I did something bad to get myself in trouble so I tried killing myself by taking a bunch of pills".  When asked patient when she took the pills she thinks either yesterday or day before.  Pt denies taking any medications today.  Pt states, " I cashed some checks and I know I am going to jail".    Allergies No Known Allergies  Level of Care/Admitting Diagnosis ED Disposition    ED Disposition Condition Comment   Admit  Hospital Area: [facility] [[id number]]  Level of Care: Telemetry [5]  Admit to tele based on following criteria: Other see comments  Comments: overdose  Diagnosis: Overdose [202577]  Admitting Physician: Lorretta Harp [[id number]]  Attending Physician: Lorretta Harp [[id number]]  PT Class (Do Not Modify): Observation [104]  PT Acc Code (Do Not Modify): Observation [10022]       B Medical/Surgery History Past Medical History:  Diagnosis Date  . Anxiety   . Hypercholesteremia   . Hypertension   . Right foot drop 01/27/2013   Past Surgical History:  Procedure Laterality Date  . BREAST CYST EXCISION Right   . BREAST SURGERY Right    "duct removal"  . HIP SURGERY Right 1998   3 screws  . PARTIAL HYSTERECTOMY       A IV Location/Drains/Wounds Patient Lines/Drains/Airways Status   Active Line/Drains/Airways    Name:   Placement date:   Placement time:   Site:   Days:   Peripheral IV 12/05/18 Left Hand   12/05/18    2029    Hand   less than 1          Intake/Output Last 24 hours No intake or output data in the 24 hours ending 12/05/18 2357  Labs/Imaging Results for orders placed or performed during the hospital encounter of  12/05/18 (from the past 48 hour(s))  Comprehensive metabolic panel     Status: Abnormal   Collection Time: 12/05/18  8:55 PM  Result Value Ref Range   Sodium 139 135 - 145 mmol/L   Potassium 3.6 3.5 - 5.1 mmol/L   Chloride 102 98 - 111 mmol/L   CO2 26 22 - 32 mmol/L   Glucose, Bld 85 70 - 99 mg/dL   BUN 20 6 - 20 mg/dL   Creatinine, Ser 7.25 (H) 0.44 - 1.00 mg/dL   Calcium 36.6 8.9 - 44.0 mg/dL   Total Protein 9.0 (H) 6.5 - 8.1 g/dL   Albumin 5.1 (H) 3.5 - 5.0 g/dL   AST 24 15 - 41 U/L   ALT 22 0 - 44 U/L   Alkaline Phosphatase 97 38 - 126 U/L   Total Bilirubin 1.0 0.3 - 1.2 mg/dL   GFR calc non Af Amer 32 (L) >60 mL/min   GFR calc Af Amer 37 (L) >60 mL/min   Anion gap 11 5 - 15    Comment: Performed at [facility], [address] [place], [place] [address]  Salicylate level     Status: None   Collection Time: 12/05/18  8:55 PM  Result Value Ref Range   Salicylate Lvl <7.0 2.8 - 30.0 mg/dL  Comment: Performed at [facility], [address] [place], [place] [address]  Acetaminophen level     Status: Abnormal   Collection Time: 12/05/18  8:55 PM  Result Value Ref Range   Acetaminophen (Tylenol), Serum <10 (L) 10 - 30 ug/mL    Comment: (NOTE) Therapeutic concentrations vary significantly. A range of 10-30 ug/mL  may be an effective concentration for many patients. However, some  are best treated at concentrations outside of this range. Acetaminophen concentrations >150 ug/mL at 4 hours after ingestion  and >50 ug/mL at 12 hours after ingestion are often associated with  toxic reactions. Performed at [facility], [address] [place], [place] [address]   Ethanol     Status: None   Collection Time: 12/05/18  8:55 PM  Result Value Ref Range   Alcohol, Ethyl (B) <10 <10 mg/dL    Comment: (NOTE) Lowest detectable limit for serum alcohol is 10 mg/dL. For medical purposes only. Performed at [facility], [address] [place], [place] [address]   CBC WITH DIFFERENTIAL     Status: Abnormal   Collection Time: 12/05/18  8:55 PM  Result Value Ref Range   WBC 13.0 (H) 4.0 - 10.5 K/uL   RBC 4.50 3.87 - 5.11 MIL/uL   Hemoglobin 13.1 12.0 - 15.0 g/dL   HCT 91.441.8 78.236.0 - 95.646.0 %   MCV 92.9 80.0 - 100.0 fL   MCH 29.1 26.0 - 34.0 pg   MCHC 31.3 30.0 - 36.0 g/dL   RDW 21.314.1 08.611.5 - 57.815.5 %   Platelets 421 (H) 150 - 400 K/uL   nRBC 0.0 0.0 - 0.2 %   Neutrophils Relative % 70 %   Neutro Abs 9.0 (H) 1.7 - 7.7 K/uL   Lymphocytes Relative 23 %   Lymphs Abs 3.0 0.7 - 4.0 K/uL   Monocytes Relative 7 %   Monocytes Absolute 0.9 0.1 - 1.0 K/uL   Eosinophils Relative 0 %   Eosinophils Absolute 0.0 0.0 - 0.5 K/uL   Basophils Relative 0 %   Basophils Absolute 0.0 0.0 - 0.1 K/uL   Immature Granulocytes 0 %   Abs Immature Granulocytes 0.04 0.00 - 0.07 K/uL    Comment: Performed at [facility], [address] [place], [place] [address]  Urine rapid drug screen (hosp performed)     Status: None   Collection Time: 12/05/18 10:43 PM  Result Value Ref Range   Opiates NONE DETECTED NONE DETECTED   Cocaine NONE DETECTED NONE DETECTED   Benzodiazepines NONE DETECTED NONE DETECTED   Amphetamines NONE DETECTED NONE DETECTED   Tetrahydrocannabinol NONE DETECTED NONE DETECTED   Barbiturates NONE DETECTED NONE DETECTED    Comment: (NOTE) DRUG SCREEN FOR MEDICAL PURPOSES ONLY.  IF CONFIRMATION IS NEEDED FOR ANY PURPOSE, NOTIFY LAB WITHIN 5 DAYS. LOWEST DETECTABLE LIMITS FOR URINE DRUG SCREEN Drug Class                     Cutoff (ng/mL) Amphetamine and metabolites    1000 Barbiturate and metabolites    200 Benzodiazepine                 200 Tricyclics and metabolites     300 Opiates and metabolites        300 Cocaine and metabolites        300 THC  50 Performed at [facility], [address] [place], [place] [address]   Sodium,  urine, random     Status: None   Collection Time: 12/05/18 10:43 PM  Result Value Ref Range   Sodium, Ur 69 mmol/L    Comment: Performed at [facility], [address] [place], [place] [address]  Creatinine, urine, random     Status: None   Collection Time: 12/05/18 10:43 PM  Result Value Ref Range   Creatinine, Urine 266.59 mg/dL    Comment: Performed at [facility], [address] [place], [place] [address]   No results found.  Pending Labs Unresulted Labs (From admission, onward)    Start     Ordered   12/06/18 0500  Hepatic function panel  Tomorrow morning,   R     12/05/18 2228   12/06/18 0500  Basic metabolic panel  Tomorrow morning,   R     12/05/18 2232   12/06/18 0500  CBC  Tomorrow morning,   R     12/05/18 2232   12/06/18 0001  Acetaminophen level  Once,   R     12/05/18 2228   12/05/18 2231  HIV antibody (Routine Testing)  Once,   R     12/05/18 2232   12/05/18 2227  Urea nitrogen, urine  Once,   R     12/05/18 2226          Vitals/Pain Today's Vitals   12/05/18 1846 12/05/18 1847 12/05/18 1854 12/05/18 2315  BP: (!) 174/111   (!) 167/91  Pulse: 93   82  Resp: 18   18  Temp: 98.2 F (36.8 C)   98.1 F (36.7 C)  TempSrc: Oral   Oral  SpO2: 100%   100%  PainSc:  0-No pain 0-No pain     Isolation Precautions No active isolations  Medications Medications  sodium chloride 0.9 % bolus 1,000 mL (has no administration in time range)  hydrALAZINE (APRESOLINE) injection 5 mg (has no administration in time range)  amLODipine (NORVASC) tablet 5 mg (has no administration in time range)  enoxaparin (LOVENOX) injection 40 mg (has no administration in time range)  ondansetron (ZOFRAN) tablet 4 mg (has no administration in time range)    Or  ondansetron (ZOFRAN) injection 4 mg (has no administration in time range)  sodium chloride 0.9 % bolus 1,000 mL (0 mLs Intravenous Stopped 12/05/18 2347)    Mobility walks Low fall  risk   Focused Assessments    R Recommendations: See Admitting Provider Note  Report given to:   Additional Notes:

## 2018-12-06 NOTE — Progress Notes (Signed)
Patient is very irritated and states that she will leave now. RN came to assess patient and explain the reason why she needs to be here with 1:1 sitter. Patient is still very rude to cussed out this [employer] fundraiser. Paged MD as well. Waiting for new order.

## 2018-12-06 NOTE — Progress Notes (Signed)
Assisted attending physician in completing IVC paperwork as pt reportedly requesting/attempting to leave- pt admitted for intentional overdose and continues to express suicidal thoughts with plan and some delusional thinking as well (see psychiatry consult). IVC served - expires 12/13/18 at 12:33pm. CSWs will seek inpatient psychiatric treatment for pt once medically stable to transfer.  Ilean Skill, MSW, LCSW Clinical Social Work 12/06/2018 [phone] weekend coverage for [phone]

## 2018-12-06 NOTE — Consult Note (Signed)
[facility]   Reason for Consult: ''suicidal'' Referring Physician:  Dr. Margo AyeHall Patient Identification: Andrea PlanKaren D Sohail MRN:  [id number] Principal Diagnosis: Schizoaffective disorder, depressive type (HCC) Diagnosis:  Principal Problem:   Schizoaffective disorder, depressive type (HCC) Active Problems:   Hypercholesteremia   Hypertension   Suicidal behavior   Overdose   AKI (acute kidney injury) (HCC)   Leukocytosis   Total Time spent with patient: 45 minutes  Subjective:   Andrea Bean is a 58 y.o. female patient admitted after suicide attempt by overdose  HPI:   Patient who reports history of Bipolar disorder, paranoia, psychosis, anxiety and depression. Patient was brought to the hospital after she attempted suicidal by overdosing on 18 unknown pills. Patient reports that she has been off her medications for years and lately she has not been sleeping , paranoia, depressed, worry and anxious. Patient reports that she has been having a strong feeling that she is going to be arrested for ''fraud'' and that the FBI is after her. She is convinced that her names has been written all over the television and her neighbor's house is being used used for surveillance. Patient continues to verbalize suicidal thoughts with plan to kill herself by overdosing on pills. She denies illicit drugs or alcohol use.  Past Psychiatric History: as above  Risk to Self:  recurrent suicidal thoughts Risk to Others:  denies Prior Inpatient Therapy:  many years ago Prior Outpatient Therapy:  Used to receive her medication for PCP   Past Medical History:  Past Medical History:  Diagnosis Date  . Anxiety   . Hypercholesteremia   . Hypertension   . Right foot drop 01/27/2013    Past Surgical History:  Procedure Laterality Date  . BREAST CYST EXCISION Right   . BREAST SURGERY Right    "duct removal"  . HIP SURGERY Right 1998   3 screws  . PARTIAL HYSTERECTOMY     Family  History:  Family History  Problem Relation Age of Onset  . Seizures Mother   . Hypertension Maternal Grandfather   . Diabetes Paternal Grandmother   . Hypertension Paternal Grandfather    Family Psychiatric  History:  Social History:  Social History   Substance and Sexual Activity  Alcohol Use No     Social History   Substance and Sexual Activity  Drug Use No    Social History   Socioeconomic History  . Marital status: Married    Spouse name: Not on file  . Number of children: Not on file  . Years of education: Not on file  . Highest education level: Not on file  Occupational History  . Occupation: disabled  Social Needs  . Financial resource strain: Not on file  . Food insecurity:    Worry: Not on file    Inability: Not on file  . Transportation needs:    Medical: Not on file    Non-medical: Not on file  Tobacco Use  . Smoking status: Never Smoker  . Smokeless tobacco: Never Used  Substance and Sexual Activity  . Alcohol use: No  . Drug use: No  . Sexual activity: Not on file  Lifestyle  . Physical activity:    Days per week: Not on file    Minutes per session: Not on file  . Stress: Not on file  Relationships  . Social connections:    Talks on phone: Not on file    Gets together: Not on file    Attends  religious service: Not on file    Active member of club or organization: Not on file    Attends meetings of clubs or organizations: Not on file    Relationship status: Not on file  Other Topics Concern  . Not on file  Social History Narrative  . Not on file   Additional Social History:    Allergies:  No Known Allergies  Labs:  Results for orders placed or performed during the hospital encounter of 12/05/18 (from the past 48 hour(s))  Comprehensive metabolic panel     Status: Abnormal   Collection Time: 12/05/18  8:55 PM  Result Value Ref Range   Sodium 139 135 - 145 mmol/L   Potassium 3.6 3.5 - 5.1 mmol/L   Chloride 102 98 - 111 mmol/L   CO2  26 22 - 32 mmol/L   Glucose, Bld 85 70 - 99 mg/dL   BUN 20 6 - 20 mg/dL   Creatinine, Ser 6.56 (H) 0.44 - 1.00 mg/dL   Calcium 81.2 8.9 - 75.1 mg/dL   Total Protein 9.0 (H) 6.5 - 8.1 g/dL   Albumin 5.1 (H) 3.5 - 5.0 g/dL   AST 24 15 - 41 U/L   ALT 22 0 - 44 U/L   Alkaline Phosphatase 97 38 - 126 U/L   Total Bilirubin 1.0 0.3 - 1.2 mg/dL   GFR calc non Af Amer 32 (L) >60 mL/min   GFR calc Af Amer 37 (L) >60 mL/min   Anion gap 11 5 - 15    Comment: Performed at [facility], [address], [place], [place] [address]  Salicylate level     Status: None   Collection Time: 12/05/18  8:55 PM  Result Value Ref Range   Salicylate Lvl <7.0 2.8 - 30.0 mg/dL    Comment: Performed at [facility], [address], [place], [place] [address]  Acetaminophen level     Status: Abnormal   Collection Time: 12/05/18  8:55 PM  Result Value Ref Range   Acetaminophen (Tylenol), Serum <10 (L) 10 - 30 ug/mL    Comment: (NOTE) Therapeutic concentrations vary significantly. A range of 10-30 ug/mL  may be an effective concentration for many patients. However, some  are best treated at concentrations outside of this range. Acetaminophen concentrations >150 ug/mL at 4 hours after ingestion  and >50 ug/mL at 12 hours after ingestion are often associated with  toxic reactions. Performed at [facility], [address] [place], [place] [address]   Ethanol     Status: None   Collection Time: 12/05/18  8:55 PM  Result Value Ref Range   Alcohol, Ethyl (B) <10 <10 mg/dL    Comment: (NOTE) Lowest detectable limit for serum alcohol is 10 mg/dL. For medical purposes only. Performed at [facility], [address] [place], [place] [address]   CBC WITH DIFFERENTIAL     Status: Abnormal   Collection Time: 12/05/18  8:55 PM  Result Value Ref Range   WBC 13.0 (H) 4.0 - 10.5 K/uL   RBC 4.50 3.87 - 5.11 MIL/uL   Hemoglobin 13.1 12.0 -  15.0 g/dL   HCT 65.9 93.5 - 70.1 %   MCV 92.9 80.0 - 100.0 fL   MCH 29.1 26.0 - 34.0 pg   MCHC 31.3 30.0 - 36.0 g/dL   RDW 77.9 39.0 - 30.0 %   Platelets 421 (H) 150 - 400 K/uL   nRBC 0.0 0.0 - 0.2 %  Neutrophils Relative % 70 %   Neutro Abs 9.0 (H) 1.7 - 7.7 K/uL   Lymphocytes Relative 23 %   Lymphs Abs 3.0 0.7 - 4.0 K/uL   Monocytes Relative 7 %   Monocytes Absolute 0.9 0.1 - 1.0 K/uL   Eosinophils Relative 0 %   Eosinophils Absolute 0.0 0.0 - 0.5 K/uL   Basophils Relative 0 %   Basophils Absolute 0.0 0.0 - 0.1 K/uL   Immature Granulocytes 0 %   Abs Immature Granulocytes 0.04 0.00 - 0.07 K/uL    Comment: Performed at [facility], [address], [place], [place] [address]  Urine rapid drug screen (hosp performed)     Status: None   Collection Time: 12/05/18 10:43 PM  Result Value Ref Range   Opiates NONE DETECTED NONE DETECTED   Cocaine NONE DETECTED NONE DETECTED   Benzodiazepines NONE DETECTED NONE DETECTED   Amphetamines NONE DETECTED NONE DETECTED   Tetrahydrocannabinol NONE DETECTED NONE DETECTED   Barbiturates NONE DETECTED NONE DETECTED    Comment: (NOTE) DRUG SCREEN FOR MEDICAL PURPOSES ONLY.  IF CONFIRMATION IS NEEDED FOR ANY PURPOSE, NOTIFY LAB WITHIN 5 DAYS. LOWEST DETECTABLE LIMITS FOR URINE DRUG SCREEN Drug Class                     Cutoff (ng/mL) Amphetamine and metabolites    1000 Barbiturate and metabolites    200 Benzodiazepine                 200 Tricyclics and metabolites     300 Opiates and metabolites        300 Cocaine and metabolites        300 THC                            50 Performed at [facility], [address], [place], [place] [address]   Sodium, urine, random     Status: None   Collection Time: 12/05/18 10:43 PM  Result Value Ref Range   Sodium, Ur 69 mmol/L    Comment: Performed at [facility], [address], [place], [place] [address]  Creatinine, urine, random      Status: None   Collection Time: 12/05/18 10:43 PM  Result Value Ref Range   Creatinine, Urine 266.59 mg/dL    Comment: Performed at [facility], [address] [place], [place] [address]  Hepatic function panel     Status: Abnormal   Collection Time: 12/06/18 12:48 AM  Result Value Ref Range   Total Protein 8.2 (H) 6.5 - 8.1 g/dL   Albumin 4.3 3.5 - 5.0 g/dL   AST 21 15 - 41 U/L   ALT 20 0 - 44 U/L   Alkaline Phosphatase 90 38 - 126 U/L   Total Bilirubin 0.7 0.3 - 1.2 mg/dL   Bilirubin, Direct 0.2 0.0 - 0.2 mg/dL   Indirect Bilirubin 0.5 0.3 - 0.9 mg/dL    Comment: Performed at [facility], [address] [place], [place] [address]  Acetaminophen level     Status: Abnormal   Collection Time: 12/06/18 12:48 AM  Result Value Ref Range   Acetaminophen (Tylenol), Serum <10 (L) 10 - 30 ug/mL    Comment: (NOTE) Therapeutic concentrations vary significantly. A range of 10-30 ug/mL  may be an effective concentration for many patients. However, some  are best treated at concentrations outside of  this range. Acetaminophen concentrations >150 ug/mL at 4 hours after ingestion  and >50 ug/mL at 12 hours after ingestion are often associated with  toxic reactions. Performed at [facility], [address], [place], [place] [address]   Basic metabolic panel     Status: Abnormal   Collection Time: 12/06/18 12:48 AM  Result Value Ref Range   Sodium 139 135 - 145 mmol/L   Potassium 3.4 (L) 3.5 - 5.1 mmol/L   Chloride 103 98 - 111 mmol/L   CO2 20 (L) 22 - 32 mmol/L   Glucose, Bld 80 70 - 99 mg/dL   BUN 18 6 - 20 mg/dL   Creatinine, Ser 0.98 (H) 0.44 - 1.00 mg/dL   Calcium 9.0 8.9 - 11.9 mg/dL   GFR calc non Af Amer 39 (L) >60 mL/min   GFR calc Af Amer 45 (L) >60 mL/min   Anion gap 16 (H) 5 - 15    Comment: Performed at [facility], [address] [place], [place] [address]  CBC     Status: Abnormal    Collection Time: 12/06/18 12:48 AM  Result Value Ref Range   WBC 11.4 (H) 4.0 - 10.5 K/uL   RBC 4.13 3.87 - 5.11 MIL/uL   Hemoglobin 12.0 12.0 - 15.0 g/dL   HCT 95.6 21.3 - 08.6 %   MCV 94.4 80.0 - 100.0 fL   MCH 29.1 26.0 - 34.0 pg   MCHC 30.8 30.0 - 36.0 g/dL   RDW 57.8 46.9 - 62.9 %   Platelets 416 (H) 150 - 400 K/uL   nRBC 0.0 0.0 - 0.2 %    Comment: Performed at [facility] [address] [place], [place] [address]    Current Facility-Administered Medications  Medication Dose Route Frequency Provider Last Rate Last Dose  . 0.9 %  sodium chloride infusion   Intravenous Continuous Lorretta Harp, MD 100 mL/hr at 12/06/18 0525    . acetaminophen (TYLENOL) tablet 650 mg  650 mg Oral Q6H PRN Dow Adolph N, DO   650 mg at 12/06/18 1141  . amLODipine (NORVASC) tablet 5 mg  5 mg Oral Daily Lorretta Harp, MD   5 mg at 12/06/18 3244  . enoxaparin (LOVENOX) injection 40 mg  40 mg Subcutaneous Daily Lorretta Harp, MD   40 mg at 12/06/18 0903  . hydrALAZINE (APRESOLINE) injection 5 mg  5 mg Intravenous Q2H PRN Lorretta Harp, MD      . hydrOXYzine (ATARAX/VISTARIL) tablet 10 mg  10 mg Oral BID Surabhi Gadea, MD      . ondansetron (ZOFRAN) tablet 4 mg  4 mg Oral Q6H PRN Lorretta Harp, MD       Or  . ondansetron (ZOFRAN) injection 4 mg  4 mg Intravenous Q6H PRN Lorretta Harp, MD      . ziprasidone (GEODON) capsule 40 mg  40 mg Oral BID WC Thedore Mins, MD        Musculoskeletal: Strength & Muscle Tone: within normal limits Gait & Station: normal Patient leans: N/A  Psychiatric Specialty Exam: Physical Exam  Psychiatric: Her speech is tangential. She is actively hallucinating. Thought content is paranoid and delusional. Cognition and memory are normal. She expresses impulsivity. She exhibits a depressed mood. She expresses suicidal ideation. She expresses suicidal plans.    Review of Systems  Constitutional: Negative.   HENT: Negative.   Eyes: Negative.   Respiratory:  Negative.   Cardiovascular: Negative.   Gastrointestinal: Negative.  Genitourinary: Negative.   Musculoskeletal: Negative.   Skin: Negative.   Neurological: Negative.   Endo/Heme/Allergies: Negative.   Psychiatric/Behavioral: Positive for depression, hallucinations and suicidal ideas.    Blood pressure (!) 159/74, pulse 82, temperature 98.5 F (36.9 C), temperature source Oral, resp. rate 16, height 5' 5.51" (1.664 m), weight 90.1 kg, SpO2 99 %.Body mass index is 32.54 kg/m.  General Appearance: Casual  Eye Contact:  Minimal  Speech:  Clear and Coherent  Volume:  Decreased  Mood:  Depressed  Affect:  Constricted  Thought Process:  Disorganized  Orientation:  Full (Time, Place, and Person)  Thought Content:  Illogical and Delusions  Suicidal Thoughts:  Yes.  with intent/plan  Homicidal Thoughts:  No  Memory:  Immediate;   Fair Recent;   Fair Remote;   Fair  Judgement:  Impaired  Insight:  Lacking  Psychomotor Activity:  Psychomotor Retardation  Concentration:  Concentration: Fair and Attention Span: Fair  Recall:  [employer] of Knowledge:  Fair  Language:  Good  Akathisia:  No  Handed:  Right  AIMS (if indicated):     Assets:  Communication Skills Social Support  ADL's:  Intact  Cognition:  WNL  Sleep:   poor     Treatment Plan Summary: 58 year old female with history of Bipolar disorder, depression, anxiety, hypertension, hyperlipidemia who was admitted to the medical hall after she intentionally overdosed on a bunch of pills. Patient remains depressed, suicidal and unable to contract for safety.  Recommendations: -Continue 1:1 sitter for safety -Ziprasidone 40 mg bid for Bipolar disorder/delusions -Hydroxyzine 10 mg bid for anxiety. -Patient will benefit from inpatient psychiatric admission for stabilization after medically stabilized. -Consider putting patient on IVC if she refuses Voluntary inpatient psychiatric admission. -Psychiatric service signing out at  this point.  Disposition: Recommend psychiatric Inpatient admission when medically cleared. Supportive therapy provided about ongoing stressors. Re-consult psych service as needed  Thedore Mins, MD 12/06/2018 12:52 PM

## 2018-12-07 DIAGNOSIS — F251 Schizoaffective disorder, depressive type: Secondary | ICD-10-CM

## 2018-12-07 LAB — CBC
HCT: 33.6 % — ABNORMAL LOW (ref 36.0–46.0)
Hemoglobin: 10.2 g/dL — ABNORMAL LOW (ref 12.0–15.0)
MCH: 29 pg (ref 26.0–34.0)
MCHC: 30.4 g/dL (ref 30.0–36.0)
MCV: 95.5 fL (ref 80.0–100.0)
Platelets: 335 10*3/uL (ref 150–400)
RBC: 3.52 MIL/uL — AB (ref 3.87–5.11)
RDW: 14.2 % (ref 11.5–15.5)
WBC: 7.4 10*3/uL (ref 4.0–10.5)
nRBC: 0 % (ref 0.0–0.2)

## 2018-12-07 LAB — BASIC METABOLIC PANEL
ANION GAP: 6 (ref 5–15)
BUN: 11 mg/dL (ref 6–20)
CO2: 21 mmol/L — ABNORMAL LOW (ref 22–32)
Calcium: 8.5 mg/dL — ABNORMAL LOW (ref 8.9–10.3)
Chloride: 113 mmol/L — ABNORMAL HIGH (ref 98–111)
Creatinine, Ser: 1.14 mg/dL — ABNORMAL HIGH (ref 0.44–1.00)
GFR calc Af Amer: >60 mL/min (ref >60)
GFR calc non Af Amer: 52 mL/min — ABNORMAL LOW (ref >60)
Glucose, Bld: 76 mg/dL (ref 70–99)
Potassium: 3.9 mmol/L (ref 3.5–5.1)
Sodium: 140 mmol/L (ref 135–145)

## 2018-12-07 LAB — UREA NITROGEN, URINE: Urea Nitrogen, Ur: 800 mg/dL

## 2018-12-07 MED ORDER — ATORVASTATIN CALCIUM 40 MG PO TABS
40.0000 mg | ORAL_TABLET | Freq: Every day | ORAL | Status: DC
  Administered 2018-12-07 – 2018-12-08 (×2): 40 mg via ORAL
  Filled 2018-12-07 (×2): qty 1

## 2018-12-07 MED ORDER — HALOPERIDOL LACTATE 5 MG/ML IJ SOLN
2.0000 mg | Freq: Once | INTRAMUSCULAR | Status: AC
  Administered 2018-12-07: 2 mg via INTRAVENOUS
  Filled 2018-12-07: qty 1

## 2018-12-07 MED ORDER — AMLODIPINE BESYLATE 10 MG PO TABS: 10.0000 mg | ORAL_TABLET | Freq: Every day | ORAL | 0 refills | Status: DC

## 2018-12-07 MED ORDER — AMLODIPINE BESYLATE 10 MG PO TABS: 10.0000 mg | ORAL_TABLET | Freq: Every day | ORAL | Status: DC

## 2018-12-07 MED ORDER — AMLODIPINE BESYLATE 10 MG PO TABS
10.0000 mg | ORAL_TABLET | Freq: Every day | ORAL | Status: DC
  Administered 2018-12-07 – 2018-12-08 (×2): 10 mg via ORAL
  Filled 2018-12-07 (×2): qty 1

## 2018-12-07 MED ORDER — ZIPRASIDONE HCL 40 MG PO CAPS: 40.0000 mg | ORAL_CAPSULE | Freq: Two times a day (BID) | ORAL | 0 refills | Status: DC

## 2018-12-07 MED ORDER — HYDROXYZINE HCL 10 MG PO TABS: 10.0000 mg | ORAL_TABLET | Freq: Two times a day (BID) | ORAL | 0 refills | Status: DC

## 2018-12-07 NOTE — Progress Notes (Signed)
Pt medically stable for transfer to psychiatric treatment per provider. CSW is making referral to [facility] and elsewhere seeking inpatient admission for pt.  Ilean Skill, MSW, LCSW Clinical Social Work 12/07/2018 [phone]  Weekend coverage for [phone]

## 2018-12-07 NOTE — Discharge Summary (Signed)
Discharge Summary  Andrea PlanKaren D Zakarian ZOX:[id number] DOB: 04/07/1961  PCP: Jackie Plumsei-Bonsu, George, MD  Admit date: 12/05/2018 Discharge date: 12/07/2018  Time spent: 35 minutes  Recommendations for Outpatient Follow-up:  1. Continue care at behavioral health  Discharge Diagnoses:  Active Hospital Problems   Diagnosis Date Noted  . Schizoaffective disorder, depressive type (HCC) 12/06/2018  . Suicidal behavior 12/05/2018  . Overdose 12/05/2018  . AKI (acute kidney injury) (HCC) 12/05/2018  . Leukocytosis 12/05/2018  . Hypercholesteremia   . Hypertension     Resolved Hospital Problems  No resolved problems to display.    Discharge Condition: Stable  Diet recommendation: Resume previous diet  Vitals:   12/06/18 2030 12/07/18 0520  BP: (!) 155/73 (!) 156/80  Pulse: 91 74  Resp: 18 18  Temp: 98.3 F (36.8 C) 98.3 F (36.8 C)  SpO2: 100% 100%    History of present illness:   Andrea PittsKaren D Matthewsis a 58 y.o.femalewith past medical history significant for bipolar disorder, psychosis, anxiety and depression, hypertension, hyperlipidemia, OSA not on CPAP, right foot drop,who presents with intentional overdose of unclear substances and suicidal ideation.  On presentation, WBC 13.0, Tylenol level less than 10, salicylate level less than 7,negativeUDS, AKI with creatinine 1.73, BUN 20, temperature normal, heart rate in 90s, oxygen saturation 100% on room air. TRH asked to admit.  12/06/18: Still suicidal and states she "wants to be dead and could not even succeed at it". States she has been depressed all her life as far she can remember.  Denies any homicidal ideation.  Denies any chest pain, dyspnea, abdominal pain, nausea, dysuria. She has no physical complaints.  12/07/18: Patient seen and examined with one-to-one sitter at her bedside.  No acute events overnight.  She has no new complaints.  Persistent paranoia with depressed mood.   Lab studies obtained today reviewed and are  stable.  No acute issues.  AKI has resolved.  Patient is medically cleared and will benefit from admission to inpatient Behavioral Health.  IVC in place since 12/06/2018.  Hospital Course:  Principal Problem:   Schizoaffective disorder, depressive type (HCC) Active Problems:   Hypercholesteremia   Hypertension   Suicidal behavior   Overdose   AKI (acute kidney injury) (HCC)   Leukocytosis  Intentional overdose with suicidal attempt Brought to the hospital after she attempted suicidal by overdosing on 18 unknown pills Unclear what she overdosed on Tylenol level less than 10, repeated Tylenol level isstill less than 10. Salicylate level less than 7. QTc 476. Hemodynamically stable. No confusion.  States she "wants to be dead and could not even succeed at it" States she has been depressed all her life Not on treatment for depression Denies homicidal ideation Secondary school teacherne-to-one sitter in place for patient safety Psychiatry consulted and saw the patient. Psychiatry started on Geodon 40 mg twice daily and hydroxyzine 10 mg twice daily since 12/06/2018 Continue psych medications as recommended by psychiatry Will benefit from admission to behavioral health due to recurrent suicidal thoughts.  Would be an unsafe discharge to home environment at this point. Continue close monitoring  Resolved AKI post hydration Suspect prerenal secondary to dehydration Presented with creatinine of 1.73 Creatinine on 12/07/2018 is 1.1 with GFR greater than 60; appears to be her baseline. Avoid dehydration  Resolved leukocytosis Suspect reactive No sign of active infective process No antibiotics given WBC 7.4K from 11.4K from 13.0K Afebrile Vital signs reviewed and are stable  Resolved hypokalemia post repletion  Hyperlipidemia Continue Lipitor  Essential hypertension Continue amlodipine  10 mg daily Hold off home Maxzide due to recent AKI Home Maxide was replaced by amlodipine.   Risks: High  risk for death or decompensation due to ongoing suicidal ideation with intentional overdose and initial failed attempt.  Patient will require at least 2 midnights for psychiatric evaluation and possible placement to inpatient behavioral health for her own safety.   DVT ppx: SQ Lovenox daily Code Status:Full code Family Communication: None at bed side. Disposition Plan: Possibly admission to inpatient behavioral health on 12/07/2018.  Unsafe discharge to home environment due to ongoing suicidal ideation.  Patient is medically cleared for discharge to inpatient behavioral health.  Consults called:  Psychiatry      Discharge Exam: BP (!) 156/80 (BP Location: Right Arm)   Pulse 74   Temp 98.3 F (36.8 C) (Oral)   Resp 18   Ht 5' 5.51" (1.664 m)   Wt 90.1 kg   SpO2 100%   BMI 32.54 kg/m  . General: 58 y.o. year-old female well developed well nourished in no acute distress.  Alert and oriented x3. . Cardiovascular: Regular rate and rhythm with no rubs or gallops.  No thyromegaly or JVD noted.   Marland Kitchen Respiratory: Clear to auscultation with no wheezes or rales. Good inspiratory effort. . Abdomen: Soft nontender nondistended with normal bowel sounds x4 quadrants. . Musculoskeletal: No lower extremity edema. 2/4 pulses in all 4 extremities. . Skin: No ulcerative lesions noted or rashes, . Psychiatry: Mood is appropriate for condition and setting  Discharge Instructions You were cared for by a hospitalist during your hospital stay. If you have any questions about your discharge medications or the care you received while you were in the hospital after you are discharged, you can call the unit and asked to speak with the hospitalist on call if the hospitalist that took care of you is not available. Once you are discharged, your primary care physician will handle any further medical issues. Please note that NO REFILLS for any discharge medications will be authorized once you are discharged,  as it is imperative that you return to your primary care physician (or establish a relationship with a primary care physician if you do not have one) for your aftercare needs so that they can reassess your need for medications and monitor your lab values.   Allergies as of 12/07/2018   No Known Allergies     Medication List    STOP taking these medications   gabapentin 100 MG capsule Commonly known as:  NEURONTIN   triamterene-hydrochlorothiazide 75-50 MG tablet Commonly known as:  MAXZIDE     TAKE these medications   amLODipine 10 MG tablet Commonly known as:  NORVASC Take 1 tablet (10 mg total) by mouth daily.   atorvastatin 40 MG tablet Commonly known as:  LIPITOR Take 40 mg by mouth daily.   hydrOXYzine 10 MG tablet Commonly known as:  ATARAX/VISTARIL Take 1 tablet (10 mg total) by mouth 2 (two) times daily.   ziprasidone 40 MG capsule Commonly known as:  GEODON Take 1 capsule (40 mg total) by mouth 2 (two) times daily with a meal.      No Known Allergies    The results of significant diagnostics from this hospitalization (including imaging, microbiology, ancillary and laboratory) are listed below for reference.    Significant Diagnostic Studies: No results found.  Microbiology: No results found for this or any previous visit (from the past 240 hour(s)).   Labs: Basic Metabolic Panel: Recent Labs  Lab  12/05/18 2055 12/06/18 0048 12/07/18 0543  NA 139 139 140  K 3.6 3.4* 3.9  CL 102 103 113*  CO2 26 20* 21*  GLUCOSE 85 80 76  BUN 20 18 11   CREATININE 1.73* 1.45* 1.14*  CALCIUM 10.0 9.0 8.5*   Liver Function Tests: Recent Labs  Lab 12/05/18 2055 12/06/18 0048  AST 24 21  ALT 22 20  ALKPHOS 97 90  BILITOT 1.0 0.7  PROT 9.0* 8.2*  ALBUMIN 5.1* 4.3   No results for input(s): LIPASE, AMYLASE in the last 168 hours. No results for input(s): AMMONIA in the last 168 hours. CBC: Recent Labs  Lab 12/05/18 2055 12/06/18 0048 12/07/18 0543  WBC  13.0* 11.4* 7.4  NEUTROABS 9.0*  --   --   HGB 13.1 12.0 10.2*  HCT 41.8 39.0 33.6*  MCV 92.9 94.4 95.5  PLT 421* 416* 335   Cardiac Enzymes: No results for input(s): CKTOTAL, CKMB, CKMBINDEX, TROPONINI in the last 168 hours. BNP: BNP (last 3 results) No results for input(s): BNP in the last 8760 hours.  ProBNP (last 3 results) No results for input(s): PROBNP in the last 8760 hours.  CBG: No results for input(s): GLUCAP in the last 168 hours.     Signed:  Darlin Drop, MD [facility] 12/07/2018, 8:06 AM

## 2018-12-07 NOTE — Progress Notes (Signed)
Pt is awake and verbalizing how anxious she is getting, feels her rights have been taken away and somebody has set her up to have her locked away. Thinks we are playing games with her, not telling her why she has been locked up with no rights. Amion text page to oncall [facility]

## 2018-12-08 ENCOUNTER — Other Ambulatory Visit: Payer: Self-pay

## 2018-12-08 ENCOUNTER — Encounter (HOSPITAL_COMMUNITY): Payer: Self-pay | Admitting: *Deleted

## 2018-12-08 ENCOUNTER — Inpatient Hospital Stay (HOSPITAL_COMMUNITY)
Admission: AD | Admit: 2018-12-08 | Discharge: 2018-12-11 | DRG: 885 | Disposition: A | Discharge status: Home / Self Care | Payer: Medicare PPO | Source: Intra-hospital | Attending: Psychiatry | Admitting: Psychiatry

## 2018-12-08 DIAGNOSIS — E7800 Pure hypercholesterolemia, unspecified: Secondary | ICD-10-CM | POA: Diagnosis present

## 2018-12-08 DIAGNOSIS — Z599 Problem related to housing and economic circumstances, unspecified: Secondary | ICD-10-CM

## 2018-12-08 DIAGNOSIS — F251 Schizoaffective disorder, depressive type: Secondary | ICD-10-CM | POA: Diagnosis not present

## 2018-12-08 DIAGNOSIS — F259 Schizoaffective disorder, unspecified: Principal | ICD-10-CM | POA: Diagnosis present

## 2018-12-08 DIAGNOSIS — F329 Major depressive disorder, single episode, unspecified: Secondary | ICD-10-CM | POA: Diagnosis present

## 2018-12-08 DIAGNOSIS — G4700 Insomnia, unspecified: Secondary | ICD-10-CM | POA: Diagnosis present

## 2018-12-08 DIAGNOSIS — Z833 Family history of diabetes mellitus: Secondary | ICD-10-CM

## 2018-12-08 DIAGNOSIS — Z8249 Family history of ischemic heart disease and other diseases of the circulatory system: Secondary | ICD-10-CM

## 2018-12-08 DIAGNOSIS — Z90711 Acquired absence of uterus with remaining cervical stump: Secondary | ICD-10-CM

## 2018-12-08 DIAGNOSIS — Z79899 Other long term (current) drug therapy: Secondary | ICD-10-CM | POA: Diagnosis not present

## 2018-12-08 DIAGNOSIS — E785 Hyperlipidemia, unspecified: Secondary | ICD-10-CM | POA: Diagnosis present

## 2018-12-08 DIAGNOSIS — I10 Essential (primary) hypertension: Secondary | ICD-10-CM | POA: Diagnosis present

## 2018-12-08 DIAGNOSIS — F419 Anxiety disorder, unspecified: Secondary | ICD-10-CM | POA: Diagnosis present

## 2018-12-08 DIAGNOSIS — Z9119 Patient's noncompliance with other medical treatment and regimen: Secondary | ICD-10-CM

## 2018-12-08 MED ORDER — ZIPRASIDONE HCL 40 MG PO CAPS
40.0000 mg | ORAL_CAPSULE | Freq: Two times a day (BID) | ORAL | Status: DC
  Administered 2018-12-08 – 2018-12-11 (×6): 40 mg via ORAL
  Filled 2018-12-08 (×10): qty 1
  Filled 2018-12-08: qty 2

## 2018-12-08 MED ORDER — HYDROXYZINE HCL 25 MG PO TABS
25.0000 mg | ORAL_TABLET | Freq: Two times a day (BID) | ORAL | Status: DC
  Administered 2018-12-09 – 2018-12-11 (×5): 25 mg via ORAL
  Filled 2018-12-08 (×12): qty 1

## 2018-12-08 MED ORDER — HYDRALAZINE HCL 10 MG PO TABS: 10.0000 mg | ORAL_TABLET | Freq: Three times a day (TID) | ORAL | 0 refills | Status: DC

## 2018-12-08 MED ORDER — HYDROXYZINE HCL 10 MG PO TABS
10.0000 mg | ORAL_TABLET | Freq: Two times a day (BID) | ORAL | Status: DC
  Administered 2018-12-08: 10 mg via ORAL
  Filled 2018-12-08 (×4): qty 1

## 2018-12-08 MED ORDER — HYDRALAZINE HCL 10 MG PO TABS
10.0000 mg | ORAL_TABLET | Freq: Three times a day (TID) | ORAL | Status: DC
  Administered 2018-12-08: 10 mg via ORAL
  Filled 2018-12-08 (×2): qty 1

## 2018-12-08 MED ORDER — ATORVASTATIN CALCIUM 40 MG PO TABS
40.0000 mg | ORAL_TABLET | Freq: Every day | ORAL | Status: DC
  Administered 2018-12-08 – 2018-12-10 (×3): 40 mg via ORAL
  Filled 2018-12-08 (×6): qty 1

## 2018-12-08 MED ORDER — ACETAMINOPHEN 325 MG PO TABS: 650.0000 mg | ORAL_TABLET | Freq: Four times a day (QID) | ORAL | Status: DC | PRN

## 2018-12-08 MED ORDER — MAGNESIUM HYDROXIDE 400 MG/5ML PO SUSP: 30.0000 mL | Freq: Every day | ORAL | Status: DC | PRN

## 2018-12-08 MED ORDER — HYDRALAZINE HCL 10 MG PO TABS
10.0000 mg | ORAL_TABLET | Freq: Every day | ORAL | Status: DC
  Administered 2018-12-09: 10 mg via ORAL
  Filled 2018-12-08 (×3): qty 1

## 2018-12-08 MED ORDER — ALUM & MAG HYDROXIDE-SIMETH 200-200-20 MG/5ML PO SUSP: 30.0000 mL | ORAL | Status: DC | PRN

## 2018-12-08 MED ORDER — AMLODIPINE BESYLATE 10 MG PO TABS
10.0000 mg | ORAL_TABLET | Freq: Every day | ORAL | Status: DC
  Administered 2018-12-09 – 2018-12-10 (×2): 10 mg via ORAL
  Filled 2018-12-08 (×3): qty 1

## 2018-12-08 MED ORDER — TRAZODONE HCL 50 MG PO TABS
50.0000 mg | ORAL_TABLET | Freq: Every evening | ORAL | Status: DC | PRN
  Filled 2018-12-08: qty 1

## 2018-12-08 NOTE — Progress Notes (Addendum)
Patient is discharged to [facility] on 12/08/2018. Discharge package is sent with patient. Patient will be transported by [employer].  RN called [facility] and report was given at 1120. No question at this time.

## 2018-12-08 NOTE — Tx Team (Signed)
Initial Treatment Plan 12/08/2018 3:42 PM Andrea Bean ZOX:[id number]    PATIENT STRESSORS: Health problems Other: "I cashed a check, I was not suppose to cash".   PATIENT STRENGTHS: Ability for insight Capable of independent living Advertising account executive for treatment/growth Supportive family/friends   PATIENT IDENTIFIED PROBLEMS: Alteration in mood (Anxiety & depression) "I have been depressed for years then I cashed a check in my name that I was not suppose to cash".   Alteration in sleep pattern "I don't sleep well at night, I sleep about 3-4 hours every night if I don't take anything".  Alteration in appetite "I just don't have no appetite, so I don't really eat".                 DISCHARGE CRITERIA:  Improved stabilization in mood, thinking, and/or behavior Verbal commitment to aftercare and medication compliance  PRELIMINARY DISCHARGE PLAN: Outpatient therapy Return to previous living arrangement  PATIENT/FAMILY INVOLVEMENT: This treatment plan has been presented to and reviewed with the patient, Andrea Bean.  The patient have been given the opportunity to ask questions and make suggestions.  Sherryl Manges, RN 12/08/2018, 3:42 PM

## 2018-12-08 NOTE — Discharge Summary (Signed)
Discharge Summary  Andrea Bean:[id number] DOB: Aug 11, 1961  PCP: Jackie Plum, MD  Admit date: 12/05/2018 Discharge date: 12/08/2018  Time spent: 35 minutes  DISCHARGE DELAYED PENDING BED PLACEMENT. CSW ASSISTING WITH DISPOSITION.  Recommendations for Outpatient Follow-up:  1. Continue care at behavioral health  Discharge Diagnoses:  Active Hospital Problems   Diagnosis Date Noted  . Schizoaffective disorder, depressive type (HCC) 12/06/2018  . Suicidal behavior 12/05/2018  . Overdose 12/05/2018  . AKI (acute kidney injury) (HCC) 12/05/2018  . Leukocytosis 12/05/2018  . Hypercholesteremia   . Hypertension     Resolved Hospital Problems  No resolved problems to display.    Discharge Condition: Stable  Diet recommendation: Resume previous diet  Vitals:   12/08/18 0558 12/08/18 0646  BP: (!) 165/76 (!) 154/72  Pulse: 83 79  Resp:    Temp:    SpO2:      History of present illness:   Andrea Dragoo Matthewsis a 58 y.o.femalewith past medical history significant for bipolar disorder, psychosis, anxiety, depression, hypertension, hyperlipidemia, OSA not on CPAP, right foot drop,who presents with intentional overdose of unclear substances and suicidal ideation. Brought to the hospital after she attempted suicidal by overdosing on 18 unknown pills.  On presentation, WBC 13.0, Tylenol level less than 10, salicylate level less than 7,negativeUDS, AKI with creatinine 1.73, BUN 20, vital signs normal. TRH asked to admit.  Hospital course complicated by severe depression with ongoing suicidal ideation.  Psychiatry consulted and saw the patient.  Started on hydroxyzine and Geodon.  AKI has resolved.   12/08/18: Patient seen and examined at bedside.  No acute events overnight.  One-to-one sitter in the room for patient safety.  Reports feeling the same.  She has no physical complaints.  Patient is medically cleared and will benefit from admission to inpatient  Behavioral Health.  IVC in place since 12/06/2018.  Hospital Course:  Principal Problem:   Schizoaffective disorder, depressive type (HCC) Active Problems:   Hypercholesteremia   Hypertension   Suicidal behavior   Overdose   AKI (acute kidney injury) (HCC)   Leukocytosis  Intentional overdose with suicidal attempt Brought to the hospital after she attempted suicidal by overdosing on 18 unknown pills Unclear what she overdosed on Tylenol level less than 10, repeated Tylenol level isstill less than 10. Salicylate level less than 7. QTc 476. Hemodynamically stable. No confusion.  States she "wants to be dead and could not even succeed at it" States she has been depressed all her life Denies homicidal ideation Secondary school teacher in place for patient safety Psychiatry consulted and saw the patient. Psychiatry started on Geodon 40 mg twice daily and hydroxyzine 10 mg twice daily since 12/06/2018 Continue psych medications as recommended by psychiatry Will benefit from admission to behavioral health due to recurrent suicidal thoughts.  Would be an unsafe discharge to home environment at this point. Continue close monitoring  Resolved AKI post hydration Suspect prerenal secondary to dehydration Presented with creatinine of 1.73 Creatinine on 12/07/2018 is 1.1 with GFR greater than 60; appears to be her baseline. Stopped IV fluid hydration on 12/07/2018.  Avoid dehydration  Resolved leukocytosis Suspect was reactive No sign of active infective process No antibiotics given WBC 7.4K from 11.4K from 13.0K Afebrile Vital signs reviewed and are stable  Resolved hypokalemia post repletion  Hyperlipidemia Continue Lipitor  Essential hypertension Continue amlodipine 10 mg daily Hold off home Maxzide due to recent AKI Home Maxide was replaced by amlodipine. Started p.o. hydralazine 10 mg 3 times daily  Risks: High risk for suicidal re-attempt, death, or decompensation due to  ongoing suicidal ideation with intentional overdose and initial failed attempt.  Patient will require at least 2 midnights for psychiatric evaluation and placement to inpatient behavioral health for her own safety.   DVT ppx: SQ Lovenox daily Code Status:Full code Family Communication: None at bed side. Disposition Plan: Possibly admission to inpatient behavioral health on 12/08/2018.  Unsafe discharge to home environment due to ongoing suicidal ideation.  Patient is medically cleared for discharge to inpatient behavioral health.  Consults called:  Psychiatry      Discharge Exam: BP (!) 154/72   Pulse 79   Temp 99 F (37.2 C) (Oral)   Resp 16   Ht 5' 5.51" (1.664 m)   Wt 90.1 kg   SpO2 98%   BMI 32.54 kg/m  . General: 58 y.o. year-old female well developed well nourished in no acute distress.  Alert and oriented x3. . Cardiovascular: Regular rate and rhythm with no rubs or gallops.  No thyromegaly or JVD noted.   Marland Kitchen Respiratory: Clear to auscultation with no wheezes or rales. Good inspiratory effort. . Abdomen: Soft nontender nondistended with normal bowel sounds x4 quadrants. . Musculoskeletal: No lower extremity edema. 2/4 pulses in all 4 extremities. . Skin: No ulcerative lesions noted or rashes, . Psychiatry: Mood is appropriate for condition and setting  Discharge Instructions You were cared for by a hospitalist during your hospital stay. If you have any questions about your discharge medications or the care you received while you were in the hospital after you are discharged, you can call the unit and asked to speak with the hospitalist on call if the hospitalist that took care of you is not available. Once you are discharged, your primary care physician will handle any further medical issues. Please note that NO REFILLS for any discharge medications will be authorized once you are discharged, as it is imperative that you return to your primary care physician (or  establish a relationship with a primary care physician if you do not have one) for your aftercare needs so that they can reassess your need for medications and monitor your lab values.   Allergies as of 12/08/2018   No Known Allergies     Medication List    STOP taking these medications   gabapentin 100 MG capsule Commonly known as:  NEURONTIN   triamterene-hydrochlorothiazide 75-50 MG tablet Commonly known as:  MAXZIDE     TAKE these medications   amLODipine 10 MG tablet Commonly known as:  NORVASC Take 1 tablet (10 mg total) by mouth daily.   atorvastatin 40 MG tablet Commonly known as:  LIPITOR Take 40 mg by mouth daily.   hydrALAZINE 10 MG tablet Commonly known as:  APRESOLINE Take 1 tablet (10 mg total) by mouth 3 (three) times daily.   hydrOXYzine 10 MG tablet Commonly known as:  ATARAX/VISTARIL Take 1 tablet (10 mg total) by mouth 2 (two) times daily.   ziprasidone 40 MG capsule Commonly known as:  GEODON Take 1 capsule (40 mg total) by mouth 2 (two) times daily with a meal.      No Known Allergies    The results of significant diagnostics from this hospitalization (including imaging, microbiology, ancillary and laboratory) are listed below for reference.    Significant Diagnostic Studies: No results found.  Microbiology: No results found for this or any previous visit (from the past 240 hour(s)).   Labs: Basic Metabolic Panel: Recent Labs  Lab 12/05/18 2055 12/06/18 0048 12/07/18 0543  NA 139 139 140  K 3.6 3.4* 3.9  CL 102 103 113*  CO2 26 20* 21*  GLUCOSE 85 80 76  BUN 20 18 11   CREATININE 1.73* 1.45* 1.14*  CALCIUM 10.0 9.0 8.5*   Liver Function Tests: Recent Labs  Lab 12/05/18 2055 12/06/18 0048  AST 24 21  ALT 22 20  ALKPHOS 97 90  BILITOT 1.0 0.7  PROT 9.0* 8.2*  ALBUMIN 5.1* 4.3   No results for input(s): LIPASE, AMYLASE in the last 168 hours. No results for input(s): AMMONIA in the last 168 hours. CBC: Recent Labs  Lab  12/05/18 2055 12/06/18 0048 12/07/18 0543  WBC 13.0* 11.4* 7.4  NEUTROABS 9.0*  --   --   HGB 13.1 12.0 10.2*  HCT 41.8 39.0 33.6*  MCV 92.9 94.4 95.5  PLT 421* 416* 335   Cardiac Enzymes: No results for input(s): CKTOTAL, CKMB, CKMBINDEX, TROPONINI in the last 168 hours. BNP: BNP (last 3 results) No results for input(s): BNP in the last 8760 hours.  ProBNP (last 3 results) No results for input(s): PROBNP in the last 8760 hours.  CBG: No results for input(s): GLUCAP in the last 168 hours.     Signed:  Darlin Drop, MD [facility] 12/08/2018, 10:41 AM

## 2018-12-08 NOTE — Progress Notes (Signed)
Patient rated her day an 8 1/2. Patient stated this is her 1st time here and her thoughts were all over the place during admission. Patient's goal for today was to "just face this day the best that I could".

## 2018-12-08 NOTE — Progress Notes (Signed)
LCSW following for inpatient psych placement.   Patient has bed at [facility] room 402 Bed 1  Accepting: [facility]  Patient is IVC and will transport by [facility].  RN report number: [phone] Please call report prior to patient leaving.    Beulah Gandy [place] [facility] CSW [phone]

## 2018-12-09 DIAGNOSIS — F259 Schizoaffective disorder, unspecified: Principal | ICD-10-CM

## 2018-12-09 LAB — RETICULOCYTES
Immature Retic Fract: 11.3 % (ref 2.3–15.9)
RBC.: 4.18 MIL/uL (ref 3.87–5.11)
Retic Count, Absolute: 76.1 10*3/uL (ref 19.0–186.0)
Retic Ct Pct: 1.8 % (ref 0.4–3.1)

## 2018-12-09 LAB — IRON AND TIBC
Iron: 35 ug/dL (ref 28–170)
Saturation Ratios: 11 % (ref 10.4–31.8)
TIBC: 333 ug/dL (ref 250–450)
UIBC: 298 ug/dL

## 2018-12-09 LAB — FERRITIN: Ferritin: 141 ng/mL (ref 11–307)

## 2018-12-09 LAB — VITAMIN B12: VITAMIN B 12: 280 pg/mL (ref 180–914)

## 2018-12-09 LAB — FOLATE: Folate: 10.7 ng/mL (ref >5.9)

## 2018-12-09 MED ORDER — TRIAMTERENE-HCTZ 37.5-25 MG PO TABS
2.0000 | ORAL_TABLET | Freq: Every day | ORAL | Status: DC
  Administered 2018-12-09 – 2018-12-10 (×2): 2 via ORAL
  Filled 2018-12-09 (×3): qty 2

## 2018-12-09 MED ORDER — CLONAZEPAM 0.5 MG PO TABS: 0.5000 mg | ORAL_TABLET | Freq: Two times a day (BID) | ORAL | Status: DC | PRN

## 2018-12-09 NOTE — Plan of Care (Addendum)
D: Patient is pleasant and cooperative. Denies SI, HI, AVH, and verbally contracts for safety. Patient reports anxiety rated 7/10 and states "my anxiety is always over 5/10". She reports feeling down and concerned about her family. When asking her about medications she reveals "being black and mental illness don't mix, my family didn't want me to take my meds in the past" "I've been on different meds and seen different doctors over the years and things kept changing and they never got straight". Patient also states "I feel like I'm floating on a boat not going anywhere and I can't find an anchor". Patient denies physical symptoms/pain.   This RN obtained order for sleep and anxiety medication from on call NP for the patient but she feel asleep and never woke up to take them.   A: Scheduled medications administered per MD order. Support provided. Patient educated on safety on the unit and medications. Routine safety checks every 15 minutes. Patient stated understanding to tell nurse about any new physical symptoms. Patient understands to tell staff of any needs.     R: No adverse drug reactions noted. Patient verbally contracts for safety. Patient remains safe at this time and will continue to monitor.   Problem: Education: Goal: Knowledge of [facility] Education information/materials will improve Outcome: Progressing   Problem: Safety: Goal: Periods of time without injury will increase Outcome: Progressing   Patient is oriented to the unit. Patient remains safe and will continue to monitor.

## 2018-12-09 NOTE — Plan of Care (Signed)
  Problem: Safety: Goal: Periods of time without injury will increase Outcome: Progressing   Problem: Activity: Goal: Interest or engagement in leisure activities will improve Outcome: Progressing  DAR NOTE: Patient presents with calm affect and pleasant  mood.  Denies suicidal thoughts, pain, auditory and visual hallucinations.  Described energy level as normal and concentration as good.  Rates depression at 3, hopelessness at 4, and anxiety at 5.  Maintained on routine safety checks.  Medications given as prescribed.  Support and encouragement offered as needed.  Attended group and participated.  States goal for today is "loving myself."  Patient observed socializing with peers in the dayroom.  Offered no complaint.
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
## 2018-12-09 NOTE — BHH Group Notes (Signed)
LCSW Group Therapy Note 12/09/2018 3:16 PM  Type of Therapy and Topic: Group Therapy: Overcoming Obstacles  Participation Level: Active  Description of Group:  In this group patients will be encouraged to explore what they see as obstacles to their own wellness and recovery. They will be guided to discuss their thoughts, feelings, and behaviors related to these obstacles. The group will process together ways to cope with barriers, with attention given to specific choices patients can make. Each patient will be challenged to identify changes they are motivated to make in order to overcome their obstacles. This group will be process-oriented, with patients participating in exploration of their own experiences as well as giving and receiving support and challenge from other group members.  Therapeutic Goals: 1. Patient will identify personal and current obstacles as they relate to admission. 2. Patient will identify barriers that currently interfere with their wellness or overcoming obstacles.  3. Patient will identify feelings, thought process and behaviors related to these barriers. 4. Patient will identify two changes they are willing to make to overcome these obstacles:   Summary of Patient Progress  Cela was engaged and participated throughout the group session. Uraina reports that her main obstacle is "not being able to see myself through my own lenses". Loryn states that her main barriers are "being a people pleaser and having low self esteem".    Therapeutic Modalities:  Cognitive Behavioral Therapy Solution Focused Therapy Motivational Interviewing Relapse Prevention Therapy   Alcario Drought Clinical Social Worker

## 2018-12-09 NOTE — H&P (Signed)
Psychiatric Admission Assessment Adult  Patient Identification: Andrea Bean MRN:  [id number] Date of Evaluation:  12/09/2018 Chief Complaint:  SCHIZOAFFECTIVE Principal Diagnosis: <principal problem not specified> Diagnosis:  Active Problems:   Schizoaffective disorder (HCC)  History of Present Illness: Patient is seen and examined.  Patient is a 58 year old female with a reported past psychiatric history significant for bipolar disorder versus schizoaffective disorder who originally presented to the [facility] emergency department on 12/05/2018 after an intentional overdose of unspecified pills.  The patient stated that she had been depressed recently.  She stated she got more anxious as well.  She stated she had not been sleeping well.  She stated she had not been taking her prescription medications as directed over the last several weeks.  She stated she realized that that is the reason why she overdosed.  She was admitted to the [facility] for evaluation.  She was monitored there, and then discharged.  She was transferred to our facility.  Her drug screen was negative.  It is suspected that perhaps this may have been over-the-counter Benadryl.  On admission to the psychiatric hospital she basically reiterated what she had told the medicine people, and that she had been noncompliant with her medicines.  She had been previously taking Latuda and clonazepam.  She stated she had not followed up with a psychiatrist, and that her primary care provider had been uncomfortable with prescribing some of her psychiatric medications.  [facility] she was started on ziprasidone 40 mg p.o. twice daily as well as amlodipine and hydralazine for hypertension.  She admitted to poor sleep, feeling helpless, hopeless and worthless but stated her anxiety was a primary issue.  She had previously taken clonazepam.  She was admitted to the hospital  for evaluation and stabilization. Associated Signs/Symptoms: Depression Symptoms:  depressed mood, anhedonia, insomnia, psychomotor agitation, fatigue, feelings of worthlessness/guilt, difficulty concentrating, hopelessness, suicidal thoughts with specific plan, suicidal attempt, anxiety, loss of energy/fatigue, disturbed sleep, (Hypo) Manic Symptoms:  Impulsivity, Irritable Mood, Anxiety Symptoms:  Excessive Worry, Psychotic Symptoms:  Denied PTSD Symptoms: Negative Total Time spent with patient: 30 minutes  Past Psychiatric History: Patient has one previous psychiatric admission many years ago.  She has been on stable medications for some time.  She has previously been taking Latuda and clonazepam.  She has been followed by a local psychiatrist, but has had financial issues with follow-up with her therapist as well as psychiatrist.  Is the patient at risk to self? Yes.    Has the patient been a risk to self in the past 6 months? No.  Has the patient been a risk to self within the distant past? No.  Is the patient a risk to others? No.  Has the patient been a risk to others in the past 6 months? No.  Has the patient been a risk to others within the distant past? No.   Prior Inpatient Therapy:   Prior Outpatient Therapy:    Alcohol Screening: 1. How often do you have a drink containing alcohol?: Never 2. How many drinks containing alcohol do you have on a typical day when you are drinking?: 1 or 2 3. How often do you have six or more drinks on one occasion?: Never AUDIT-C Score: 0 4. How often during the last year have you found that you were not able to stop drinking once you had started?: Never 5. How often during the last year  have you failed to do what was normally expected from you becasue of drinking?: Never 6. How often during the last year have you needed a first drink in the morning to get yourself going after a heavy drinking session?: Never 7. How often during the  last year have you had a feeling of guilt of remorse after drinking?: Never 8. How often during the last year have you been unable to remember what happened the night before because you had been drinking?: Never 9. Have you or someone else been injured as a result of your drinking?: No 10. Has a relative or friend or a doctor or another health worker been concerned about your drinking or suggested you cut down?: No Alcohol Use Disorder Identification Test Final Score (AUDIT): 0 Substance Abuse History in the last 12 months:  No. Consequences of Substance Abuse: Negative Previous Psychotropic Medications: Yes  Psychological Evaluations: Yes  Past Medical History:  Past Medical History:  Diagnosis Date  . Anxiety   . Hypercholesteremia   . Hypertension   . Right foot drop 01/27/2013    Past Surgical History:  Procedure Laterality Date  . BREAST CYST EXCISION Right   . BREAST SURGERY Right    "duct removal"  . HIP SURGERY Right 1998   3 screws  . PARTIAL HYSTERECTOMY     Family History:  Family History  Problem Relation Age of Onset  . Seizures Mother   . Hypertension Maternal Grandfather   . Diabetes Paternal Grandmother   . Hypertension Paternal Grandfather    Family Psychiatric  History: Noncontributory Tobacco Screening: Have you used any form of tobacco in the last 30 days? (Cigarettes, Smokeless Tobacco, Cigars, and/or Pipes): No Social History:  Social History   Substance and Sexual Activity  Alcohol Use No     Social History   Substance and Sexual Activity  Drug Use No    Additional Social History: Marital status: Married Number of Years Married: 40 What types of issues is patient dealing with in the relationship?: Patient denies any ongoing issues within their marriage. She states that her husband recently lost his job.  Additional relationship information: N/A  Are you sexually active?: Yes What is your sexual orientation?: Heterosexual  Has your sexual  activity been affected by drugs, alcohol, medication, or emotional stress?: No  Does patient have children?: Yes How many children?: 2 How is patient's relationship with their children?: Patient reports having a good relationship with her two adult daughters. She states that her only son passed away a few years ago to cancer.                          Allergies:  No Known Allergies Lab Results: No results found for this or any previous visit (from the past 48 hour(s)).  Blood Alcohol level:  Lab Results  Component Value Date   ETH <10 12/05/2018    Metabolic Disorder Labs:  Lab Results  Component Value Date   HGBA1C  11/20/2009    6.1 (NOTE) The ADA recommends the following therapeutic goal for glycemic control related to Hgb A1c measurement: Goal of therapy: <6.5 Hgb A1c  Reference: American Diabetes Association: Clinical Practice Recommendations 2010, Diabetes Care, 2010, 33: (Suppl  1).   MPG 128 11/20/2009   No results found for: PROLACTIN Lab Results  Component Value Date   CHOL (H) 11/20/2009    242        ATP III CLASSIFICATION:  <  200     mg/dL   Desirable  595-396  mg/dL   Borderline High  >=728    mg/dL   High          TRIG 979 (H) 11/20/2009   HDL 46 11/20/2009   CHOLHDL 5.3 11/20/2009   VLDL 44 (H) 11/20/2009   LDLCALC (H) 11/20/2009    152        Total Cholesterol/HDL:CHD Risk Coronary Heart Disease Risk Table                     Men   Women  1/2 Average Risk   3.4   3.3  Average Risk       5.0   4.4  2 X Average Risk   9.6   7.1  3 X Average Risk  23.4   11.0        Use the calculated Patient Ratio above and the CHD Risk Table to determine the patient's CHD Risk.        ATP III CLASSIFICATION (LDL):  <100     mg/dL   Optimal  150-413  mg/dL   Near or Above                    Optimal  130-159  mg/dL   Borderline  643-837  mg/dL   High  >793     mg/dL   Very High    Current Medications: Current Facility-Administered Medications   Medication Dose Route Frequency Provider Last Rate Last Dose  . acetaminophen (TYLENOL) tablet 650 mg  650 mg Oral Q6H PRN Money, Gerlene Burdock, FNP      . alum & mag hydroxide-simeth (MAALOX/MYLANTA) 200-200-20 MG/5ML suspension 30 mL  30 mL Oral Q4H PRN Money, Feliz Beam B, FNP      . amLODipine (NORVASC) tablet 10 mg  10 mg Oral Daily Money, Gerlene Burdock, FNP   10 mg at 12/09/18 0827  . atorvastatin (LIPITOR) tablet 40 mg  40 mg Oral q1800 Money, Gerlene Burdock, FNP   40 mg at 12/08/18 1839  . clonazePAM (KLONOPIN) tablet 0.5 mg  0.5 mg Oral BID PRN Antonieta Pert, MD      . hydrOXYzine (ATARAX/VISTARIL) tablet 25 mg  25 mg Oral BID Nira Conn A, NP   25 mg at 12/09/18 0827  . magnesium hydroxide (MILK OF MAGNESIA) suspension 30 mL  30 mL Oral Daily PRN Money, Gerlene Burdock, FNP      . traZODone (DESYREL) tablet 50 mg  50 mg Oral QHS PRN Jackelyn Poling, NP      . triamterene-hydrochlorothiazide (MAXZIDE-25) 37.5-25 MG per tablet 2 tablet  2 tablet Oral Daily Antonieta Pert, MD   2 tablet at 12/09/18 1153  . ziprasidone (GEODON) capsule 40 mg  40 mg Oral BID WC Money, Gerlene Burdock, FNP   40 mg at 12/09/18 0827   PTA Medications: Medications Prior to Admission  Medication Sig Dispense Refill Last Dose  . amLODipine (NORVASC) 10 MG tablet Take 1 tablet (10 mg total) by mouth daily. 30 tablet 0   . atorvastatin (LIPITOR) 40 MG tablet Take 40 mg by mouth daily.   Past Week at Unknown time  . hydrALAZINE (APRESOLINE) 10 MG tablet Take 1 tablet (10 mg total) by mouth 3 (three) times daily. 90 tablet 0   . hydrOXYzine (ATARAX/VISTARIL) 10 MG tablet Take 1 tablet (10 mg total) by mouth 2 (two) times daily. 60 tablet 0   .  ziprasidone (GEODON) 40 MG capsule Take 1 capsule (40 mg total) by mouth 2 (two) times daily with a meal. 60 capsule 0     Musculoskeletal: Strength & Muscle Tone: within normal limits Gait & Station: normal Patient leans: N/A  Psychiatric Specialty Exam: Physical Exam  Nursing note and  vitals reviewed. Constitutional: She is oriented to person, place, and time. She appears well-developed and well-nourished.  HENT:  Head: Normocephalic and atraumatic.  Respiratory: Effort normal.  Neurological: She is alert and oriented to person, place, and time.    ROS  Blood pressure (!) 161/99, pulse (!) 109, temperature 97.9 F (36.6 C), resp. rate 16, height  (1.676 m), weight 90.7 kg, SpO2 100 %.Body mass index is 32.28 kg/m.  General Appearance: Casual  Eye Contact:  Fair  Speech:  Normal Rate  Volume:  Decreased  Mood:  Depressed  Affect:  Congruent  Thought Process:  Coherent and Descriptions of Associations: Intact  Orientation:  Full (Time, Place, and Person)  Thought Content:  Logical  Suicidal Thoughts:  No  Homicidal Thoughts:  No  Memory:  Immediate;   Fair Recent;   Fair Remote;   Fair  Judgement:  Intact  Insight:  Fair  Psychomotor Activity:  Psychomotor Retardation  Concentration:  Concentration: Fair and Attention Span: Fair  Recall:  [employer] of Knowledge:  Fair  Language:  Good  Akathisia:  Negative  Handed:  Right  AIMS (if indicated):     Assets:  Communication Skills Desire for Improvement Financial Resources/Insurance Housing Physical Health Resilience Social Support  ADL's:  Intact  Cognition:  WNL  Sleep:  Number of Hours: 6    Treatment Plan Summary: Daily contact with patient to assess and evaluate symptoms and progress in treatment, Medication management and Plan : Patient is seen and examined.  Patient is a 58 year old female with the above-stated past psychiatric history who was admitted after an intentional overdose, and this was in a suicide attempt.  She had been recently noncompliant with her medications.  She was started on ziprasidone while in the medical hospital, and will continue this 40 mg p.o. twice daily.  I will place the clonazepam back on board at 0.5 mg p.o. twice daily as needed.  She will also have available  trazodone 50 mg p.o. nightly as needed insomnia.  She has a history of hypertension as well as hyperlipidemia.  She was placed on amlodipine and hydralazine in the medical hospital, but I am going to stop that.  She had been previously treated with Maxide, and we will restart that and continue the amlodipine.  She will also have available hydroxyzine for anxiety as well.  Review of her laboratories revealed a mildly elevated creatinine at 1.14, and this will be monitored especially with restarting her Maxide.  She is also mildly anemic with a hemoglobin of 10.2 and hematocrit of 33.6.  We will go on and order iron studies for her.  Her drug screen on admission was negative for benzodiazepines, so it is unlikely she overdosed on them.  Rest of her drug screen was negative.  Observation Level/Precautions:  15 minute checks  Laboratory:  Chemistry Profile  Psychotherapy:    Medications:    Consultations:    Discharge Concerns:    Estimated LOS:  Other:     Physician Treatment Plan for Primary Diagnosis: <principal problem not specified> Long Term Goal(s): Improvement in symptoms so as ready for discharge  Short Term Goals: Ability to identify changes  in lifestyle to reduce recurrence of condition will improve, Ability to verbalize feelings will improve, Ability to disclose and discuss suicidal ideas, Ability to demonstrate self-control will improve, Ability to identify and develop effective coping behaviors will improve, Ability to maintain clinical measurements within normal limits will improve and Compliance with prescribed medications will improve  Physician Treatment Plan for Secondary Diagnosis: Active Problems:   Schizoaffective disorder (HCC)  Long Term Goal(s): Improvement in symptoms so as ready for discharge  Short Term Goals: Ability to identify changes in lifestyle to reduce recurrence of condition will improve, Ability to verbalize feelings will improve, Ability to disclose and discuss  suicidal ideas, Ability to demonstrate self-control will improve, Ability to identify and develop effective coping behaviors will improve, Ability to maintain clinical measurements within normal limits will improve and Compliance with prescribed medications will improve  I certify that inpatient services furnished can reasonably be expected to improve the patient's condition.    Antonieta Pert, MD 3/10/20202:33 PM

## 2018-12-09 NOTE — [facility] Suicide Risk Assessment (Signed)
[facility] Admission Suicide Risk Assessment   Nursing information obtained from:  Patient Demographic factors:  Unemployed, Low socioeconomic status Current Mental Status:  Suicidal ideation indicated by patient, Self-harm behaviors, Self-harm thoughts Loss Factors:  Decrease in vocational status, Decline in physical health Historical Factors:  Prior suicide attempts, Impulsivity Risk Reduction Factors:  Living with another person, especially a relative, Positive therapeutic relationship, Sense of responsibility to family  Total Time spent with patient: 30 minutes Principal Problem: <principal problem not specified> Diagnosis:  Active Problems:   Schizoaffective disorder (HCC)  Subjective Data: Patient is seen and examined.  Patient is a 58 year old female with a reported past psychiatric history significant for bipolar disorder versus schizoaffective disorder who originally presented to the [facility] emergency department on 12/05/2018 after an intentional overdose of unspecified pills.  The patient stated that she had been depressed recently.  She stated she got more anxious as well.  She stated she had not been sleeping well.  She stated she had not been taking her prescription medications as directed over the last several weeks.  She stated she realized that that is the reason why she overdosed.  She was admitted to the [facility] for evaluation.  She was monitored there, and then discharged.  She was transferred to our facility.  Her drug screen was negative.  It is suspected that perhaps this may have been over-the-counter Benadryl.  On admission to the psychiatric hospital she basically reiterated what she had told the medicine people, and that she had been noncompliant with her medicines.  She had been previously taking Latuda and clonazepam.  She stated she had not followed up with a psychiatrist, and that her primary care provider had been uncomfortable with  prescribing some of her psychiatric medications.  [facility] she was started on ziprasidone 40 mg p.o. twice daily as well as amlodipine and hydralazine for hypertension.  She admitted to poor sleep, feeling helpless, hopeless and worthless but stated her anxiety was a primary issue.  She had previously taken clonazepam.  She was admitted to the hospital for evaluation and stabilization.  Continued Clinical Symptoms:  Alcohol Use Disorder Identification Test Final Score (AUDIT): 0 The "Alcohol Use Disorders Identification Test", Guidelines for Use in Primary Care, Second Edition.  World Science writer [facility]). Score between 0-7:  no or low risk or alcohol related problems. Score between 8-15:  moderate risk of alcohol related problems. Score between 16-19:  high risk of alcohol related problems. Score 20 or above:  warrants further diagnostic evaluation for alcohol dependence and treatment.   CLINICAL FACTORS:   Bipolar Disorder:   Depressive phase Schizophrenia:   Depressive state   Musculoskeletal: Strength & Muscle Tone: within normal limits Gait & Station: normal Patient leans: N/A  Psychiatric Specialty Exam: Physical Exam  Nursing note and vitals reviewed. Constitutional: She appears well-developed and well-nourished.  HENT:  Head: Normocephalic and atraumatic.  Respiratory: Effort normal.  Neurological: She is alert.    ROS  Blood pressure (!) 161/99, pulse (!) 109, temperature 97.9 F (36.6 C), resp. rate 16, height 5\' 6"  (1.676 m), weight 90.7 kg, SpO2 100 %.Body mass index is 32.28 kg/m.  General Appearance: Casual  Eye Contact:  Fair  Speech:  Normal Rate  Volume:  Decreased  Mood:  Depressed  Affect:  Congruent  Thought Process:  Coherent and Descriptions of Associations: Intact  Orientation:  Full (Time, Place, and Person)  Thought  Content:  Logical  Suicidal Thoughts:  No  Homicidal Thoughts:  No  Memory:  Immediate;    Fair Recent;   Fair Remote;   Fair  Judgement:  Intact  Insight:  Fair  Psychomotor Activity:  Increased  Concentration:  Concentration: Fair and Attention Span: Fair  Recall:  [employer] of Knowledge:  Fair  Language:  Fair  Akathisia:  Negative  Handed:  Right  AIMS (if indicated):     Assets:  Desire for Improvement Financial Resources/Insurance Housing Intimacy Leisure Time Physical Health Resilience  ADL's:  Intact  Cognition:  WNL  Sleep:  Number of Hours: 6      COGNITIVE FEATURES THAT CONTRIBUTE TO RISK:  None    SUICIDE RISK:   Mild:  Suicidal ideation of limited frequency, intensity, duration, and specificity.  There are no identifiable plans, no associated intent, mild dysphoria and related symptoms, good self-control (both objective and subjective assessment), few other risk factors, and identifiable protective factors, including available and accessible social support.  PLAN OF CARE: Patient is seen and examined.  Patient is a 58 year old female with the above-stated past psychiatric history who was admitted after an intentional overdose, and this was in a suicide attempt.  She had been recently noncompliant with her medications.  She was started on ziprasidone while in the medical hospital, and will continue this 40 mg p.o. twice daily.  I will place the clonazepam back on board at 0.5 mg p.o. twice daily as needed.  She will also have available trazodone 50 mg p.o. nightly as needed insomnia.  She has a history of hypertension as well as hyperlipidemia.  She was placed on amlodipine and hydralazine in the medical hospital, but I am going to stop that.  She had been previously treated with Maxide, and we will restart that and continue the amlodipine.  She will also have available hydroxyzine for anxiety as well.  Review of her laboratories revealed a mildly elevated creatinine at 1.14, and this will be monitored especially with restarting her Maxide.  She is also  mildly anemic with a hemoglobin of 10.2 and hematocrit of 33.6.  We will go on and order iron studies for her.  Her drug screen on admission was negative for benzodiazepines, so it is unlikely she overdosed on them.  Rest of her drug screen was negative.  I certify that inpatient services furnished can reasonably be expected to improve the patient's condition.   Antonieta Pert, MD 12/09/2018, 12:43 PM

## 2018-12-10 LAB — BASIC METABOLIC PANEL
Anion gap: 14 (ref 5–15)
BUN: 17 mg/dL (ref 6–20)
CO2: 21 mmol/L — ABNORMAL LOW (ref 22–32)
Calcium: 10 mg/dL (ref 8.9–10.3)
Chloride: 102 mmol/L (ref 98–111)
Creatinine, Ser: 1.41 mg/dL — ABNORMAL HIGH (ref 0.44–1.00)
GFR calc Af Amer: 47 mL/min — ABNORMAL LOW (ref >60)
GFR, EST NON AFRICAN AMERICAN: 40 mL/min — AB (ref >60)
Glucose, Bld: 111 mg/dL — ABNORMAL HIGH (ref 70–99)
Potassium: 3.5 mmol/L (ref 3.5–5.1)
SODIUM: 137 mmol/L (ref 135–145)

## 2018-12-10 MED ORDER — CLONAZEPAM 0.5 MG PO TABS
0.5000 mg | ORAL_TABLET | Freq: Three times a day (TID) | ORAL | Status: DC | PRN
  Administered 2018-12-10: 0.5 mg via ORAL
  Filled 2018-12-10: qty 1

## 2018-12-10 MED ORDER — METOPROLOL TARTRATE 25 MG PO TABS
25.0000 mg | ORAL_TABLET | Freq: Two times a day (BID) | ORAL | Status: DC
  Administered 2018-12-10 – 2018-12-11 (×2): 25 mg via ORAL
  Filled 2018-12-10 (×6): qty 1

## 2018-12-10 MED ORDER — LISINOPRIL 10 MG PO TABS
10.0000 mg | ORAL_TABLET | Freq: Every day | ORAL | Status: DC
  Administered 2018-12-10 – 2018-12-11 (×2): 10 mg via ORAL
  Filled 2018-12-10 (×4): qty 1

## 2018-12-10 MED ORDER — TRAZODONE HCL 100 MG PO TABS
100.0000 mg | ORAL_TABLET | Freq: Every evening | ORAL | Status: DC | PRN
  Administered 2018-12-10: 100 mg via ORAL
  Filled 2018-12-10: qty 1

## 2018-12-10 NOTE — Progress Notes (Signed)
Patient ID: Andrea Bean, female   DOB: Dec 20, 1960, 57 y.o.   MRN: [id number] DAR Note: Pt with blunted affect, denied SI/HI/AVH earlier in the shift, mood depressed.  Pt refused offer for medications for insomnia, but was unable to have a restful night's sleep, as she kept waking up through the night.  Pt is being observed for safety on Q15 minute checks.  Will continue to monitor.

## 2018-12-10 NOTE — Progress Notes (Signed)
[facility] MD Progress Note  12/10/2018 11:45 AM Andrea Bean  MRN:  [id number] Subjective:  Patient is a 58 year old female with a reported past psychiatric history significant for bipolar disorder versus schizoaffective disorder who originally presented to the [facility] emergency department on 12/05/2018 after an intentional overdose of unspecified pills.   Objective: Patient is seen and examined.  Patient is a 58 year old female with a past psychiatric history significant for bipolar disorder versus schizoaffective disorder who is seen in follow-up.  She stated she is doing better today.  She stated that her mood is probably an 8 out of 10 were 10 out of 10 is normal.  She denied having any thoughts about harming herself.  She still remains somewhat anxious.  She is tolerating the Geodon without difficulty and no psychotic symptoms.  She stated her mood was improving.  Her blood pressure remains elevated, and we discussed her previous treatment.  She was placed back on Maxide yesterday, but her creatinine went back up to 1.4 as well as the fact that her blood pressures not controlled.  She stated she is also been on Prinivil in the past.  She also is tachycardic.  Her anxiety is under better control with 1/2 mg of Klonopin twice a day as needed, but we discussed potentially increasing it to 3 times a day secondary to anxiety.  Her blood pressure this morning is 164/97, her rates 105.  She is afebrile.  Nursing notes reflect that she only slept 4.75 hours last night.  Principal Problem: <principal problem not specified> Diagnosis: Active Problems:   Schizoaffective disorder (HCC)  Total Time spent with patient: 20 minutes  Past Psychiatric History: See admission H&P  Past Medical History:  Past Medical History:  Diagnosis Date  . Anxiety   . Hypercholesteremia   . Hypertension   . Right foot drop 01/27/2013    Past Surgical History:  Procedure Laterality Date  . BREAST CYST  EXCISION Right   . BREAST SURGERY Right    "duct removal"  . HIP SURGERY Right 1998   3 screws  . PARTIAL HYSTERECTOMY     Family History:  Family History  Problem Relation Age of Onset  . Seizures Mother   . Hypertension Maternal Grandfather   . Diabetes Paternal Grandmother   . Hypertension Paternal Grandfather    Family Psychiatric  History: See admission H&P Social History:  Social History   Substance and Sexual Activity  Alcohol Use No     Social History   Substance and Sexual Activity  Drug Use No    Social History   Socioeconomic History  . Marital status: Married    Spouse name: Not on file  . Number of children: Not on file  . Years of education: Not on file  . Highest education level: Not on file  Occupational History  . Occupation: disabled  Social Needs  . Financial resource strain: Not on file  . Food insecurity:    Worry: Not on file    Inability: Not on file  . Transportation needs:    Medical: Not on file    Non-medical: Not on file  Tobacco Use  . Smoking status: Never Smoker  . Smokeless tobacco: Never Used  Substance and Sexual Activity  . Alcohol use: No  . Drug use: No  . Sexual activity: Not on file  Lifestyle  . Physical activity:    Days per week: Not on file    Minutes per session: Not  on file  . Stress: Not on file  Relationships  . Social connections:    Talks on phone: Not on file    Gets together: Not on file    Attends religious service: Not on file    Active member of club or organization: Not on file    Attends meetings of clubs or organizations: Not on file    Relationship status: Not on file  Other Topics Concern  . Not on file  Social History Narrative  . Not on file   Additional Social History:                         Sleep: Fair  Appetite:  Fair  Current Medications: Current Facility-Administered Medications  Medication Dose Route Frequency Provider Last Rate Last Dose  . acetaminophen  (TYLENOL) tablet 650 mg  650 mg Oral Q6H PRN Money, Gerlene Burdockravis B, FNP      . alum & mag hydroxide-simeth (MAALOX/MYLANTA) 200-200-20 MG/5ML suspension 30 mL  30 mL Oral Q4H PRN Money, Gerlene Burdockravis B, FNP      . atorvastatin (LIPITOR) tablet 40 mg  40 mg Oral q1800 Money, Gerlene Burdockravis B, FNP   40 mg at 12/09/18 1712  . clonazePAM (KLONOPIN) tablet 0.5 mg  0.5 mg Oral TID PRN Antonieta Pertlary, Dam Ashraf Lawson, MD      . hydrOXYzine (ATARAX/VISTARIL) tablet 25 mg  25 mg Oral BID Nira ConnBerry, Jason A, NP   25 mg at 12/10/18 0826  . lisinopril (PRINIVIL,ZESTRIL) tablet 10 mg  10 mg Oral Daily Antonieta Pertlary, Haleema Vanderheyden Lawson, MD      . magnesium hydroxide (MILK OF MAGNESIA) suspension 30 mL  30 mL Oral Daily PRN Money, Gerlene Burdockravis B, FNP      . metoprolol tartrate (LOPRESSOR) tablet 25 mg  25 mg Oral BID Antonieta Pertlary, Sequoia Witz Lawson, MD      . traZODone (DESYREL) tablet 50 mg  50 mg Oral QHS PRN Nira ConnBerry, Jason A, NP      . ziprasidone (GEODON) capsule 40 mg  40 mg Oral BID WC Money, Gerlene Burdockravis B, FNP   40 mg at 12/10/18 0825    Lab Results:  Results for orders placed or performed during the hospital encounter of 12/08/18 (from the past 48 hour(s))  Vitamin B12     Status: None   Collection Time: 12/09/18  6:19 PM  Result Value Ref Range   Vitamin B-12 280 180 - 914 pg/mL    Comment: (NOTE) This assay is not validated for testing neonatal or myeloproliferative syndrome specimens for Vitamin B12 levels. Performed at [facility], [address] [place], [place] [address]   Folate     Status: None   Collection Time: 12/09/18  6:19 PM  Result Value Ref Range   Folate 10.7 >5.9 ng/mL    Comment: Performed at [facility], [address] [place], [place] [address]  Iron and TIBC     Status: None   Collection Time: 12/09/18  6:19 PM  Result Value Ref Range   Iron 35 28 - 170 ug/dL   TIBC 191333 478250 - 295450 ug/dL   Saturation Ratios 11 10.4 - 31.8 %   UIBC 298 ug/dL    Comment: Performed at [facility], [address] [place], [place] [address]  Ferritin     Status: None   Collection Time: 12/09/18  6:19 PM  Result Value Ref Range   Ferritin 141 11 - 307 ng/mL  Comment: Performed at [facility], [address] [place], [place] [address]  Reticulocytes     Status: None   Collection Time: 12/09/18  6:19 PM  Result Value Ref Range   Retic Ct Pct 1.8 0.4 - 3.1 %   RBC. 4.18 3.87 - 5.11 MIL/uL   Retic Count, Absolute 76.1 19.0 - 186.0 K/uL   Immature Retic Fract 11.3 2.3 - 15.9 %    Comment: Performed at [facility], [address] [place], [place] [address]  Basic metabolic panel     Status: Abnormal   Collection Time: 12/10/18  6:46 AM  Result Value Ref Range   Sodium 137 135 - 145 mmol/L   Potassium 3.5 3.5 - 5.1 mmol/L   Chloride 102 98 - 111 mmol/L   CO2 21 (L) 22 - 32 mmol/L   Glucose, Bld 111 (H) 70 - 99 mg/dL   BUN 17 6 - 20 mg/dL   Creatinine, Ser 0.98 (H) 0.44 - 1.00 mg/dL   Calcium 11.9 8.9 - 14.7 mg/dL   GFR calc non Af Amer 40 (L) >60 mL/min   GFR calc Af Amer 47 (L) >60 mL/min   Anion gap 14 5 - 15    Comment: Performed at [facility], [address] [place], [place] [address]    Blood Alcohol level:  Lab Results  Component Value Date   ETH <10 12/05/2018    Metabolic Disorder Labs: Lab Results  Component Value Date   HGBA1C  11/20/2009    6.1 (NOTE) The ADA recommends the following therapeutic goal for glycemic control related to Hgb A1c measurement: Goal of therapy: <6.5 Hgb A1c  Reference: American Diabetes Association: Clinical Practice Recommendations 2010, Diabetes Care, 2010, 33: (Suppl  1).   MPG 128 11/20/2009   No results found for: PROLACTIN Lab Results  Component Value Date   CHOL (H) 11/20/2009    242        ATP III CLASSIFICATION:  <200     mg/dL   Desirable  213-086  mg/dL   Borderline High  >=578    mg/dL   High          TRIG 469 (H) 11/20/2009   HDL 46 11/20/2009   CHOLHDL  5.3 11/20/2009   VLDL 44 (H) 11/20/2009   LDLCALC (H) 11/20/2009    152        Total Cholesterol/HDL:CHD Risk Coronary Heart Disease Risk Table                     Men   Women  1/2 Average Risk   3.4   3.3  Average Risk       5.0   4.4  2 X Average Risk   9.6   7.1  3 X Average Risk  23.4   11.0        Use the calculated Patient Ratio above and the CHD Risk Table to determine the patient's CHD Risk.        ATP III CLASSIFICATION (LDL):  <100     mg/dL   Optimal  629-528  mg/dL   Near or Above                    Optimal  130-159  mg/dL   Borderline  413-244  mg/dL   High  >010     mg/dL   Very High    Physical Findings: AIMS: Facial and Oral Movements  Muscles of Facial Expression: None, normal Lips and Perioral Area: None, normal Jaw: None, normal Tongue: None, normal,Extremity Movements Upper (arms, wrists, hands, fingers): None, normal Lower (legs, knees, ankles, toes): None, normal, Trunk Movements Neck, shoulders, hips: None, normal, Overall Severity Severity of abnormal movements (highest score from questions above): None, normal Incapacitation due to abnormal movements: None, normal Patient's awareness of abnormal movements (rate only patient's report): No Awareness, Dental Status Current problems with teeth and/or dentures?: Yes Does patient usually wear dentures?: Yes  CIWA:    COWS:     Musculoskeletal: Strength & Muscle Tone: within normal limits Gait & Station: normal Patient leans: N/A  Psychiatric Specialty Exam: Physical Exam  Nursing note and vitals reviewed. Constitutional: She appears well-developed and well-nourished.  HENT:  Head: Normocephalic and atraumatic.  Respiratory: Effort normal.  Neurological: She is alert.    ROS  Blood pressure (!) 164/97, pulse (!) 105, temperature 98.1 F (36.7 C), temperature source Oral, resp. rate 16, height 5\' 6"  (1.676 m), weight 90.7 kg, SpO2 100 %.Body mass index is 32.28 kg/m.  General Appearance:  Casual  Eye Contact:  Fair  Speech:  Normal Rate  Volume:  Normal  Mood:  Anxious  Affect:  Congruent  Thought Process:  Coherent and Descriptions of Associations: Intact  Orientation:  Full (Time, Place, and Person)  Thought Content:  Logical  Suicidal Thoughts:  No  Homicidal Thoughts:  No  Memory:  Immediate;   Fair Recent;   Fair Remote;   Fair  Judgement:  Intact  Insight:  Fair  Psychomotor Activity:  Increased  Concentration:  Concentration: Fair and Attention Span: Fair  Recall:  [employer] of Knowledge:  Fair  Language:  Fair  Akathisia:  Negative  Handed:  Right  AIMS (if indicated):     Assets:  Communication Skills Desire for Improvement Financial Resources/Insurance Housing Physical Health Resilience Social Support  ADL's:  Intact  Cognition:  WNL  Sleep:  Number of Hours: 4.75     Treatment Plan Summary: Daily contact with patient to assess and evaluate symptoms and progress in treatment, Medication management and Plan : Patient is seen and examined.  Patient is a 58 year old female with the above-stated past psychiatric history seen in follow-up.  Patient stated her mood was better, but still very anxious.  Her blood pressure is elevated.  She denied suicidal ideation or side effects to her medications.  We will continue her Geodon at 40 mg p.o. twice daily.  I am going to increase her clonazepam to 0.5 mg p.o. 3 times daily as needed anxiety.  I am also going to increase her trazodone to 100 mg p.o. nightly as needed.  With regard to her hypertension her creatinine went back up to 1.4 with the Maxide.  I am going to stop that today.  Additionally the amlodipine does not appear to be doing a great deal.  I am going to start Lopressor 25 mg p.o. twice daily, and as well lisinopril 10 mg p.o. daily.  Hopefully she will tolerate that.  No change in her Lipitor at this point. 1.  Stop amlodipine 2.  Stop Maxide 3.  Increase clonazepam to 0.5 mg p.o. 3 times daily  as needed anxiety. 4.  Continue hydroxyzine 25 mg p.o. twice daily as needed anxiety. 5.  Add lisinopril 10 mg p.o. daily for hypertension. 6.  Add metoprolol short acting 25 mg p.o. twice daily for hypertension and tachycardia. 7.  Increase trazodone 200 mg p.o. nightly  as needed insomnia. 8.  Continue ziprasidone 40 mg p.o. twice daily for mood stability and psychosis. 9.  Disposition planning-in progress.  Antonieta Pert, MD 12/10/2018, 11:45 AM

## 2018-12-10 NOTE — Progress Notes (Signed)
The patient stated that her positive event for the day was that she learned that her blood pressure is now within normal limits. She also stated that she received some good advice from her peers. Her goal for tomorrow is to attend more of the groups.

## 2018-12-10 NOTE — Tx Team (Signed)
Interdisciplinary Treatment and Diagnostic Plan Update  12/10/2018 Time of Session:  Andrea Bean MRN: [id number]  Principal Diagnosis: <principal problem not specified>  Secondary Diagnoses: Active Problems:   Schizoaffective disorder (HCC)   Current Medications:  Current Facility-Administered Medications  Medication Dose Route Frequency Provider Last Rate Last Dose  . acetaminophen (TYLENOL) tablet 650 mg  650 mg Oral Q6H PRN Money, Gerlene Burdock, FNP      . alum & mag hydroxide-simeth (MAALOX/MYLANTA) 200-200-20 MG/5ML suspension 30 mL  30 mL Oral Q4H PRN Money, Gerlene Burdock, FNP      . atorvastatin (LIPITOR) tablet 40 mg  40 mg Oral q1800 Money, Gerlene Burdock, FNP   40 mg at 12/09/18 1712  . clonazePAM (KLONOPIN) tablet 0.5 mg  0.5 mg Oral TID PRN Antonieta Pert, MD      . hydrOXYzine (ATARAX/VISTARIL) tablet 25 mg  25 mg Oral BID Nira Conn A, NP   25 mg at 12/10/18 0826  . lisinopril (PRINIVIL,ZESTRIL) tablet 10 mg  10 mg Oral Daily Antonieta Pert, MD      . magnesium hydroxide (MILK OF MAGNESIA) suspension 30 mL  30 mL Oral Daily PRN Money, Gerlene Burdock, FNP      . metoprolol tartrate (LOPRESSOR) tablet 25 mg  25 mg Oral BID Antonieta Pert, MD      . traZODone (DESYREL) tablet 50 mg  50 mg Oral QHS PRN Nira Conn A, NP      . ziprasidone (GEODON) capsule 40 mg  40 mg Oral BID WC Money, Gerlene Burdock, FNP   40 mg at 12/10/18 0825   PTA Medications: Medications Prior to Admission  Medication Sig Dispense Refill Last Dose  . amLODipine (NORVASC) 10 MG tablet Take 1 tablet (10 mg total) by mouth daily. 30 tablet 0   . atorvastatin (LIPITOR) 40 MG tablet Take 40 mg by mouth daily.   Past Week at Unknown time  . hydrALAZINE (APRESOLINE) 10 MG tablet Take 1 tablet (10 mg total) by mouth 3 (three) times daily. 90 tablet 0   . hydrOXYzine (ATARAX/VISTARIL) 10 MG tablet Take 1 tablet (10 mg total) by mouth 2 (two) times daily. 60 tablet 0   . ziprasidone (GEODON) 40 MG capsule Take 1  capsule (40 mg total) by mouth 2 (two) times daily with a meal. 60 capsule 0     Patient Stressors: Health problems Other: "I cashed a check, I was not suppose to cash".  Patient Strengths: Ability for insight Capable of independent living Licensed conveyancer Motivation for treatment/growth Supportive family/friends  Treatment Modalities: Medication Management, Group therapy, Case management,  1 to 1 session with clinician, Psychoeducation, Recreational therapy.   Physician Treatment Plan for Primary Diagnosis: <principal problem not specified> Long Term Goal(s): Improvement in symptoms so as ready for discharge Improvement in symptoms so as ready for discharge   Short Term Goals: Ability to identify changes in lifestyle to reduce recurrence of condition will improve Ability to verbalize feelings will improve Ability to disclose and discuss suicidal ideas Ability to demonstrate self-control will improve Ability to identify and develop effective coping behaviors will improve Ability to maintain clinical measurements within normal limits will improve Compliance with prescribed medications will improve Ability to identify changes in lifestyle to reduce recurrence of condition will improve Ability to verbalize feelings will improve Ability to disclose and discuss suicidal ideas Ability to demonstrate self-control will improve Ability to identify and develop effective coping behaviors will improve Ability to maintain clinical  measurements within normal limits will improve Compliance with prescribed medications will improve  Medication Management: Evaluate patient's response, side effects, and tolerance of medication regimen.  Therapeutic Interventions: 1 to 1 sessions, Unit Group sessions and Medication administration.  Evaluation of Outcomes: Progressing  Physician Treatment Plan for Secondary Diagnosis: Active Problems:   Schizoaffective disorder (HCC)  Long  Term Goal(s): Improvement in symptoms so as ready for discharge Improvement in symptoms so as ready for discharge   Short Term Goals: Ability to identify changes in lifestyle to reduce recurrence of condition will improve Ability to verbalize feelings will improve Ability to disclose and discuss suicidal ideas Ability to demonstrate self-control will improve Ability to identify and develop effective coping behaviors will improve Ability to maintain clinical measurements within normal limits will improve Compliance with prescribed medications will improve Ability to identify changes in lifestyle to reduce recurrence of condition will improve Ability to verbalize feelings will improve Ability to disclose and discuss suicidal ideas Ability to demonstrate self-control will improve Ability to identify and develop effective coping behaviors will improve Ability to maintain clinical measurements within normal limits will improve Compliance with prescribed medications will improve     Medication Management: Evaluate patient's response, side effects, and tolerance of medication regimen.  Therapeutic Interventions: 1 to 1 sessions, Unit Group sessions and Medication administration.  Evaluation of Outcomes: Progressing   RN Treatment Plan for Primary Diagnosis: <principal problem not specified> Long Term Goal(s): Knowledge of disease and therapeutic regimen to maintain health will improve  Short Term Goals: Ability to participate in decision making will improve, Ability to verbalize feelings will improve, Ability to disclose and discuss suicidal ideas, Ability to identify and develop effective coping behaviors will improve and Compliance with prescribed medications will improve  Medication Management: RN will administer medications as ordered by provider, will assess and evaluate patient's response and provide education to patient for prescribed medication. RN will report any adverse and/or side  effects to prescribing provider.  Therapeutic Interventions: 1 on 1 counseling sessions, Psychoeducation, Medication administration, Evaluate responses to treatment, Monitor vital signs and CBGs as ordered, Perform/monitor CIWA, COWS, AIMS and Fall Risk screenings as ordered, Perform wound care treatments as ordered.  Evaluation of Outcomes: Progressing   LCSW Treatment Plan for Primary Diagnosis: <principal problem not specified> Long Term Goal(s): Safe transition to appropriate next level of care at discharge, Engage patient in therapeutic group addressing interpersonal concerns.  Short Term Goals: Engage patient in aftercare planning with referrals and resources  Therapeutic Interventions: Assess for all discharge needs, 1 to 1 time with Social worker, Explore available resources and support systems, Assess for adequacy in community support network, Educate family and significant other(s) on suicide prevention, Complete Psychosocial Assessment, Interpersonal group therapy.  Evaluation of Outcomes: Progressing   Progress in Treatment: Attending groups: Yes. Participating in groups: Yes. Taking medication as prescribed: Yes. Toleration medication: Yes. Family/Significant other contact made: No, will contact:  the patient's husband Patient understands diagnosis: Yes. Discussing patient identified problems/goals with staff: Yes. Medical problems stabilized or resolved: Yes. Denies suicidal/homicidal ideation: Yes. Issues/concerns per patient self-inventory: No. Other:    New problem(s) identified: None   New Short Term/Long Term Goal(s):medication stabilization, elimination of SI thoughts, development of comprehensive mental wellness plan.   Patient Goals:  I don't sleep well at night, I sleep about 3-4 hours every night if I don't take anything  Discharge Plan or Barriers: CSW will continue to follow   Reason for Continuation of Hospitalization: Anxiety Delusions  Depression Medication stabilization Suicidal ideation  Estimated Length of Stay: 12/12/2018  Attendees: Patient: 12/10/2018 11:03 AM  Physician: Dr. Landry MellowGreg Clary, MD 12/10/2018 11:03 AM  Nursing: Ethelene BrownsAnthony.A 12/10/2018 11:03 AM  RN Care Manager: 12/10/2018 11:03 AM  Social Worker: Baldo DaubJolan Zalan Shidler, LCSWA 12/10/2018 11:03 AM  Recreational Therapist:  12/10/2018 11:03 AM  Other: Marciano SequinJanet Sykes, NP  12/10/2018 11:03 AM  Other:  12/10/2018 11:03 AM  Other: 12/10/2018 11:03 AM    Scribe for Treatment Team: Maeola SarahJolan E Krystena Reitter, LCSWA 12/10/2018 11:03 AM

## 2018-12-10 NOTE — Plan of Care (Signed)
  Problem: Safety: Goal: Periods of time without injury will increase Outcome: Progressing   D: Pt alert and oriented on the unit. Pt denies SI/HI, A/VH. Pt is cooperative on the unit. A: Education, support and encouragement provided, q15 minute safety checks remain in effect. Medications administered per MD orders. R: No reactions/side effects to medicine noted. Pt denies any concerns at this time, and verbally contracts for safety. Pt ambulating on the unit with no issues. Pt remains safe on and off the unit.

## 2018-12-10 NOTE — Progress Notes (Signed)
Andrea Bean attended wrap-up group. Pt appears depressed/anxious in affect and mood. Pt denies SI/HI/AVH/Pain at this time. Pt c/o of insomnia and anxiety at this time. Support provided. Will continue with POC.

## 2018-12-10 NOTE — BHH Group Notes (Signed)
Occupational Therapy Group Note  Date:  12/10/2018 Time:  12:59 PM  Group Topic/Focus:  Self Esteem Action Plan:   The focus of this group is to help patients create a plan to continue to build self-esteem after discharge.  Participation Level:  Minimal  Participation Quality:  Appropriate  Affect:  Flat  Cognitive:  Appropriate  Insight: Improving  Engagement in Group:  Limited  Modes of Intervention:  Activity, Discussion, Education and Socialization  Additional Comments:    S: none offered this date  O: OT tx with focus on self esteem building this date. Education given on definition of self esteem, with both causes of low and high self esteem identified. Activity given for pt to identify a positive/aspiring trait for each letter of the alphabet. Pt to work with peers to help complete activity and build positive thinking.   A: Pt presents with flat affect, engaged and participatory throughout session. Pt not offering subjective statements this date, but seen following along in body language. A-Z activity then administered.  P: OT group will be x1 per week while pt inpatient.  Dalphine Handing, MSOT, OTR/L Behavioral Health OT/ Acute Relief OT PHP Office: [phone]  Dalphine Handing 12/10/2018, 12:59 PM

## 2018-12-11 MED ORDER — CLONAZEPAM 0.5 MG PO TABS: 0.5000 mg | ORAL_TABLET | Freq: Three times a day (TID) | ORAL | 0 refills | Status: DC | PRN

## 2018-12-11 MED ORDER — ACETAMINOPHEN 325 MG PO TABS: 650.0000 mg | ORAL_TABLET | Freq: Four times a day (QID) | ORAL | Status: AC | PRN

## 2018-12-11 MED ORDER — LISINOPRIL 10 MG PO TABS: 10.0000 mg | ORAL_TABLET | Freq: Every day | ORAL | 0 refills | Status: AC

## 2018-12-11 MED ORDER — TRAZODONE HCL 100 MG PO TABS: 100.0000 mg | ORAL_TABLET | Freq: Every evening | ORAL | 0 refills | Status: DC | PRN

## 2018-12-11 MED ORDER — HYDROXYZINE HCL 25 MG PO TABS: 25.0000 mg | ORAL_TABLET | Freq: Two times a day (BID) | ORAL | 0 refills | Status: DC

## 2018-12-11 MED ORDER — METOPROLOL TARTRATE 25 MG PO TABS: 25.0000 mg | ORAL_TABLET | Freq: Two times a day (BID) | ORAL | 0 refills | Status: AC

## 2018-12-11 MED ORDER — ZIPRASIDONE HCL 40 MG PO CAPS: 40.0000 mg | ORAL_CAPSULE | Freq: Two times a day (BID) | ORAL | 0 refills | Status: DC

## 2018-12-11 NOTE — Discharge Summary (Signed)
Physician Discharge Summary Note  Patient:  Andrea Bean is an 58 y.o., female MRN:  [id number] DOB:  December 10, 1960 Patient phone:  [phone] (home)  Patient address:   [address] [place] [address],  Total Time spent with patient: 20 minutes  Date of Admission:  12/08/2018 Date of Discharge: 12/11/18  Reason for Admission:  Suicide attempt by overdose  Principal Problem: Schizoaffective disorder [facility]) Discharge Diagnoses: Principal Problem:   Schizoaffective disorder [facility])   Past Psychiatric History: Patient has one previous psychiatric admission many years ago.  She has been on stable medications for some time.  She has previously been taking Latuda and clonazepam.  She has been followed by a local psychiatrist, but has had financial issues with follow-up with her therapist as well as psychiatrist.  Past Medical History:  Past Medical History:  Diagnosis Date  . Anxiety   . Hypercholesteremia   . Hypertension   . Right foot drop 01/27/2013    Past Surgical History:  Procedure Laterality Date  . BREAST CYST EXCISION Right   . BREAST SURGERY Right    "duct removal"  . HIP SURGERY Right 1998   3 screws  . PARTIAL HYSTERECTOMY     Family History:  Family History  Problem Relation Age of Onset  . Seizures Mother   . Hypertension Maternal Grandfather   . Diabetes Paternal Grandmother   . Hypertension Paternal Grandfather    Family Psychiatric  History: Denies Social History:  Social History   Substance and Sexual Activity  Alcohol Use No     Social History   Substance and Sexual Activity  Drug Use No    Social History   Socioeconomic History  . Marital status: Married    Spouse name: Not on file  . Number of children: Not on file  . Years of education: Not on file  . Highest education level: Not on file  Occupational History  . Occupation: disabled  Social Needs  . Financial resource strain: Not on file  . Food insecurity:    Worry: Not on  file    Inability: Not on file  . Transportation needs:    Medical: Not on file    Non-medical: Not on file  Tobacco Use  . Smoking status: Never Smoker  . Smokeless tobacco: Never Used  Substance and Sexual Activity  . Alcohol use: No  . Drug use: No  . Sexual activity: Not on file  Lifestyle  . Physical activity:    Days per week: Not on file    Minutes per session: Not on file  . Stress: Not on file  Relationships  . Social connections:    Talks on phone: Not on file    Gets together: Not on file    Attends religious service: Not on file    Active member of club or organization: Not on file    Attends meetings of clubs or organizations: Not on file    Relationship status: Not on file  Other Topics Concern  . Not on file  Social History Narrative  . Not on file    Hospital Course:   12/09/18 [facility] MD Assessment: 58 year old female with a reported past psychiatric history significant for bipolar disorder versus schizoaffective disorder who originally presented to the [facility] emergency department on 12/05/2018 after an intentional overdose of unspecified pills. The patient stated that she had been depressed recently. She stated she got more anxious as well. She stated she had not been  sleeping well. She stated she had not been taking her prescription medications as directed over the last several weeks. She stated she realized that that is the reason why she overdosed. [facility]. She was monitored there, and then discharged. She was transferred to our facility. Her drug screen was negative. It is suspected that perhaps this may have been over-the-counter Benadryl. On admission to the psychiatric hospital she basically reiterated what she had told the medicine people, and that she had been noncompliant with her medicines. She had been previously taking Latuda and clonazepam. She stated she had  not followed up with a psychiatrist, and that her primary care provider had been uncomfortable with prescribing some of her psychiatric medications. [facility] she was started on ziprasidone 40 mg p.o. twice daily as well as amlodipine and hydralazine for hypertension. She admitted to poor sleep, feeling helpless, hopeless and worthless but stated her anxiety was a primary issue. She had previously taken clonazepam. She was admitted to the hospital for evaluation and stabilization.  [facility] days. The patient stabilized on medication and therapy. Patient was discharged on Klonopin 0.5 mg TID PRN, Lipitor 40 mg Daily, Vistaril 25 mg BID, Lisinopril 10 mg Daily, Lopressor 25 mg BID, Trazodone 100 mg QHS PRN, and Geodon 40 mg BID. Patient has shown improvement with improved mood, affect, sleep, appetite, and interaction. Patient has attended group and participated. Patient has been seen in the day room interacting with peers and staff appropriately. Patient denies any SI/HI/AVH and contracts for safety. [facility]. Patient is provided with prescriptions for their medications upon discharge.   Physical Findings: AIMS: Facial and Oral Movements Muscles of Facial Expression: (P) None, normal Lips and Perioral Area: (P) None, normal Jaw: (P) None, normal Tongue: (P) None, normal,Extremity Movements Upper (arms, wrists, hands, fingers): (P) None, normal Lower (legs, knees, ankles, toes): (P) None, normal, Trunk Movements Neck, shoulders, hips: (P) None, normal, Overall Severity Severity of abnormal movements (highest score from questions above): (P) None, normal Incapacitation due to abnormal movements: (P) None, normal Patient's awareness of abnormal movements (rate only patient's report): (P) No Awareness, Dental Status Current problems with teeth and/or dentures?: (P)  Yes Does patient usually wear dentures?: (P) Yes  CIWA:    COWS:     Musculoskeletal: Strength & Muscle Tone: within normal limits Gait & Station: normal Patient leans: N/A  Psychiatric Specialty Exam: See MD Discharge SRA Physical Exam  ROS  Blood pressure 119/75, pulse (!) 103, temperature 98 F (36.7 C), temperature source Oral, resp. rate 16, height 5\' 6"  (1.676 m), weight 90.7 kg, SpO2 100 %.Body mass index is 32.28 kg/m.  Sleep:  Number of Hours: 6.75     Have you used any form of tobacco in the last 30 days? (Cigarettes, Smokeless Tobacco, Cigars, and/or Pipes): No  Has this patient used any form of tobacco in the last 30 days? (Cigarettes, Smokeless Tobacco, Cigars, and/or Pipes) Yes, No  Blood Alcohol level:  Lab Results  Component Value Date   ETH <10 12/05/2018    Metabolic Disorder Labs:  Lab Results  Component Value Date   HGBA1C  11/20/2009    6.1 (NOTE) The ADA recommends the following therapeutic goal for glycemic control related to Hgb A1c measurement: Goal of therapy: <6.5 Hgb A1c  Reference: American Diabetes Association: Clinical  Practice Recommendations 2010, Diabetes Care, 2010, 33: (Suppl  1).   MPG 128 11/20/2009   No results found for: PROLACTIN Lab Results  Component Value Date   CHOL (H) 11/20/2009    242        ATP III CLASSIFICATION:  <200     mg/dL   Desirable  542-706  mg/dL   Borderline High  >=237    mg/dL   High          TRIG 628 (H) 11/20/2009   HDL 46 11/20/2009   CHOLHDL 5.3 11/20/2009   VLDL 44 (H) 11/20/2009   LDLCALC (H) 11/20/2009    152        Total Cholesterol/HDL:CHD Risk Coronary Heart Disease Risk Table                     Men   Women  1/2 Average Risk   3.4   3.3  Average Risk       5.0   4.4  2 X Average Risk   9.6   7.1  3 X Average Risk  23.4   11.0        Use the calculated Patient Ratio above and the CHD Risk Table to determine the patient's CHD Risk.        ATP III CLASSIFICATION (LDL):  <100      mg/dL   Optimal  315-176  mg/dL   Near or Above                    Optimal  130-159  mg/dL   Borderline  160-737  mg/dL   High  >106     mg/dL   Very High    See Psychiatric Specialty Exam and Suicide Risk Assessment completed by Attending Physician prior to discharge.  Discharge destination:  Home  Is patient on multiple antipsychotic therapies at discharge:  No   Has Patient had three or more failed trials of antipsychotic monotherapy by history:  No  Recommended Plan for Multiple Antipsychotic Therapies: NA  Discharge Instructions    Discharge instructions   Complete by:  As directed    Patient is instructed to take all prescribed medications as recommended. Report any side effects or adverse reactions to your outpatient psychiatrist. Patient is instructed to abstain from alcohol and illegal drugs while on prescription medications. In the event of worsening symptoms, patient is instructed to call the crisis hotline, 911, or go to the nearest emergency department for evaluation and treatment.     Allergies as of 12/11/2018   No Known Allergies     Medication List    STOP taking these medications   amLODipine 10 MG tablet Commonly known as:  NORVASC   hydrALAZINE 10 MG tablet Commonly known as:  APRESOLINE     TAKE these medications     Indication  acetaminophen 325 MG tablet Commonly known as:  TYLENOL Take 2 tablets (650 mg total) by mouth every 6 (six) hours as needed for mild pain.  Indication:  Pain   atorvastatin 40 MG tablet Commonly known as:  LIPITOR Take 40 mg by mouth daily.  Indication:  High Amount of Fats in the Blood   clonazePAM 0.5 MG tablet Commonly known as:  KLONOPIN Take 1 tablet (0.5 mg total) by mouth 3 (three) times daily as needed (anxiety).  Indication:  Anxiety   hydrOXYzine 25 MG tablet Commonly known as:  ATARAX/VISTARIL Take 1 tablet (25 mg total) by mouth  2 (two) times daily. For anxiety What changed:    medication  strength  how much to take  additional instructions  Indication:  Feeling Anxious   lisinopril 10 MG tablet Commonly known as:  PRINIVIL,ZESTRIL Take 1 tablet (10 mg total) by mouth daily. For high blood pressure Start taking on:  December 12, 2018  Indication:  High Blood Pressure Disorder   metoprolol tartrate 25 MG tablet Commonly known as:  LOPRESSOR Take 1 tablet (25 mg total) by mouth 2 (two) times daily. For high blood pressure  Indication:  High Blood Pressure Disorder   traZODone 100 MG tablet Commonly known as:  DESYREL Take 1 tablet (100 mg total) by mouth at bedtime as needed for sleep.  Indication:  Trouble Sleeping   ziprasidone 40 MG capsule Commonly known as:  GEODON Take 1 capsule (40 mg total) by mouth 2 (two) times daily with a meal. For mood What changed:  additional instructions  Indication:  Mood      Follow-up Information    [facility] Follow up on 12/23/2018.   Specialty:  Behavioral Health Why:  Medication management appointment with Dr. Jama Flavors is Tuesday, 3/24 at 9:00a.  Therapy appointment with Toma Copier is Monday, 3/30 at 11:00a. Please bring your photo ID, proof of insurance, SSN, current medications, and discharge paperwork.  Contact information: [address] [place] [address] [phone]          Follow-up recommendations:  Continue activity as tolerated. Continue diet as recommended by your PCP. Ensure to keep all appointments with outpatient providers.  Comments:  Patient is instructed prior to discharge to: Take all medications as prescribed by his/her mental healthcare provider. Report any adverse effects and or reactions from the medicines to his/her outpatient provider promptly. Patient has been instructed & cautioned: To not engage in alcohol and or illegal drug use while on prescription medicines. In the event of worsening symptoms, patient is instructed to call the  crisis hotline, 911 and or go to the nearest ED for appropriate evaluation and treatment of symptoms. To follow-up with his/her primary care provider for your other medical issues, concerns and or health care needs.    Signed: Gerlene Burdock Romelo Sciandra, FNP 12/11/2018, 11:35 AM

## 2018-12-11 NOTE — [facility] Suicide Risk Assessment (Signed)
[facility] INPATIENT:  Family/Significant Other Suicide Prevention Education  Suicide Prevention Education:  Education Completed; with husband, [name] [phone]) has been identified by the patient as the family member/significant other with whom the patient will be residing, and identified as the person(s) who will aid the patient in the event of a mental health crisis (suicidal ideations/suicide attempt).  With written consent from the patient, the family member/significant other has been provided the following suicide prevention education, prior to the and/or following the discharge of the patient.  The suicide prevention education provided includes the following:  Suicide risk factors  Suicide prevention and interventions  National Suicide Hotline telephone number  [facility] assessment telephone number  [facility] Emergency Assistance 911  [facility] and/or Residential Mobile Crisis Unit telephone number  Request made of family/significant other to:  Remove weapons (e.g., guns, rifles, knives), all items previously/currently identified as safety concern.    Remove drugs/medications (over-the-counter, prescriptions, illicit drugs), all items previously/currently identified as a safety concern.  The family member/significant other verbalizes understanding of the suicide prevention education information provided.  The family member/significant other agrees to remove the items of safety concern listed above.  Patient's husband reports he does not have any questions or concerns regarding the patient discharging home today.  Andrea Bean 12/11/2018, 11:06 AM

## 2018-12-11 NOTE — Progress Notes (Signed)
  [facility] Adult Case Management Discharge Plan :  Will you be returning to the same living situation after discharge:  Yes,  patient reports she is returning home with her husband At discharge, do you have transportation home?: Yes,  patient reports her husband is picking her up at discharge Do you have the ability to pay for your medications: Yes,  [employer], [employer] Medicare  Release of information consent forms completed and in the chart;  Patient's signature needed at discharge.  Patient to Follow up at: Follow-up Information    [facility] Follow up on 12/23/2018.   Specialty:  Behavioral Health Why:  Medication management appointment with Dr. Jama Flavors is Tuesday, 3/24 at 9:00a.  Therapy appointment with Toma Copier is Monday, 3/30 at 11:00a. Please bring your photo ID, proof of insurance, SSN, current medications, and discharge paperwork.  Contact information: [address] [place] [address] [phone]          Next level of care provider has access to [facility] Link:yes  Safety Planning and Suicide Prevention discussed: Yes,  with the patient's husband  Have you used any form of tobacco in the last 30 days? (Cigarettes, Smokeless Tobacco, Cigars, and/or Pipes): No  Has patient been referred to the Quitline?: N/A patient is not a smoker  Patient has been referred for addiction treatment: N/A  Maeola Sarah, LCSWA 12/11/2018, 10:43 AM

## 2018-12-11 NOTE — Progress Notes (Signed)
Patient ID: Andrea Bean, female   DOB: [birth year], 58 y.o.   MRN: [id number]  Nursing Progress Note [id number]  On initial approach, patient is seen up in the dayroom interacting with peers. Patient presents calm, pleasant and cooperative. Patient compliant with scheduled medications and denies side effects. Patient is seen attending groups and visible in the milieu. Patient currently denies SI/HI/AVH. Patient reports she is hopeful for discharge today.  Patient is educated about and provided medication per provider's orders. Patient safety maintained with q15 min safety checks and low fall risk precautions. Emotional support given, 1:1 interaction, and active listening provided. Patient encouraged to attend meals, groups, and work on treatment plan and goals. Labs, vital signs and patient behavior monitored throughout shift.   Patient contracts for safety with staff. Patient remains safe on the unit at this time and agrees to come to staff with any issues/concerns. Patient is interacting with peers appropriately on the unit. Will continue to support and monitor.   Patient's self-inventory sheet Rated Energy Level  Normal  Rated Sleep  Good  Rated Appetite  Fair  Rated Anxiety (0-10)  2  Rated Hopelessness (0-10)  1  Rated Depression (0-10)  3  Daily Goal  "making goals for help me with anxiety, depression and feelings when I go home"  Any Additional Comments:

## 2018-12-11 NOTE — [facility] Group Notes (Signed)
[facility] [employer] Group Therapy      12/11/2018 1:33 PM  Type of Therapy: Mental Health Association Presentation  Participation Level: Active  Participation Quality: Attentive  Affect: Appropriate  Cognitive: Oriented  Insight: Developing/Improving  Engagement in Therapy: Engaged  Modes of Intervention: Discussion, Education and Socialization  Summary of Progress/Problems: Mental Health Association (MHA) Speaker came to talk about his personal journey with mental health. The pt processed ways by which to relate to the speaker. MHA speaker provided handouts and educational information pertaining to groups and services offered by the MHA. Pt was engaged in speaker's presentation and was receptive to resources provided.    Yvonda Fouty LCSWA Clinical Social Worker   

## 2018-12-11 NOTE — [facility] Suicide Risk Assessment (Signed)
[facility] Discharge Suicide Risk Assessment   Principal Problem: <principal problem not specified> Discharge Diagnoses: Active Problems:   Schizoaffective disorder (HCC)   Total Time spent with patient: 15 minutes  Musculoskeletal: Strength & Muscle Tone: within normal limits Gait & Station: normal Patient leans: N/A  Psychiatric Specialty Exam: Review of Systems  All other systems reviewed and are negative.   Blood pressure 119/75, pulse (!) 103, temperature 98 F (36.7 C), temperature source Oral, resp. rate 16, height 5\' 6"  (1.676 m), weight 90.7 kg, SpO2 100 %.Body mass index is 32.28 kg/m.  General Appearance: Casual  Eye Contact::  Good  Speech:  Normal Rate409  Volume:  Normal  Mood:  Anxious  Affect:  Congruent  Thought Process:  Coherent and Descriptions of Associations: Intact  Orientation:  Full (Time, Place, and Person)  Thought Content:  Logical  Suicidal Thoughts:  No  Homicidal Thoughts:  No  Memory:  Immediate;   Fair Recent;   Fair Remote;   Fair  Judgement:  Intact  Insight:  Good  Psychomotor Activity:  Normal  Concentration:  Good  Recall:  Good  Fund of Knowledge:Good  Language: Good  Akathisia:  Negative  Handed:  Right  AIMS (if indicated):     Assets:  Communication Skills Desire for Improvement Financial Resources/Insurance Housing Physical Health Resilience Social Support  Sleep:  Number of Hours: 6.75  Cognition: WNL  ADL's:  Intact   Mental Status Per Nursing Assessment::   On Admission:  Suicidal ideation indicated by patient, Self-harm behaviors, Self-harm thoughts  Demographic Factors:  Low socioeconomic status and Unemployed  Loss Factors: Financial problems/change in socioeconomic status  Historical Factors: Impulsivity  Risk Reduction Factors:   Sense of responsibility to family, Living with another person, especially a relative and Positive social support  Continued Clinical Symptoms:  Bipolar Disorder:   Depressive  phase Schizophrenia:   Depressive state  Cognitive Features That Contribute To Risk:  None    Suicide Risk:  Minimal: No identifiable suicidal ideation.  Patients presenting with no risk factors but with morbid ruminations; may be classified as minimal risk based on the severity of the depressive symptoms  Follow-up Information    [facility] Follow up on 12/23/2018.   Specialty:  Behavioral Health Why:  Medication management appointment with Dr. Jama Flavors is Tuesday, 3/24 at 9:00a.  Therapy appointment with Toma Copier is Monday, 3/30 at 11:00a. Please bring your photo ID, proof of insurance, SSN, current medications, and discharge paperwork.  Contact information: [address] [place] [address] [phone]          Plan Of Care/Follow-up recommendations:  Activity:  ad lib  Antonieta Pert, MD 12/11/2018, 11:04 AM

## 2018-12-11 NOTE — Progress Notes (Signed)
Patient ID: Andrea Bean, female   DOB: 07-Dec-1960, 58 y.o.   MRN: [id number]  Discharge Note  Patient denies SI/HI and states readiness for discharge.  Written and verbal discharge instructions reviewed with the patient. Patient accepting to information and verbalized understanding with no concerns. All belongings returned to patient from the unit and secured lockers. Patient has completed their Suicide Safety Plan and has been provided Suicide Prevention resources. Patient provided an opportunity to complete and return Patient Satisfaction Survey.   Patient was safely escorted to the lobby for discharge. Patient discharged from [facility] with prescriptions sent to pharmacy, personal belongings, follow-up appointment in place and discharge instructions.

## 2018-12-23 ENCOUNTER — Ambulatory Visit (INDEPENDENT_AMBULATORY_CARE_PROVIDER_SITE_OTHER): Payer: Medicare PPO | Admitting: Psychiatry

## 2018-12-23 ENCOUNTER — Other Ambulatory Visit (HOSPITAL_COMMUNITY): Payer: Self-pay

## 2018-12-23 ENCOUNTER — Encounter (HOSPITAL_COMMUNITY): Payer: Self-pay | Admitting: Psychiatry

## 2018-12-23 ENCOUNTER — Other Ambulatory Visit: Payer: Self-pay

## 2018-12-23 VITALS — BP 194/94 | HR 70 | Temp 98.4°F | Ht 66.0 in | Wt 202.0 lb

## 2018-12-23 DIAGNOSIS — F251 Schizoaffective disorder, depressive type: Secondary | ICD-10-CM | POA: Diagnosis not present

## 2018-12-23 MED ORDER — CLONAZEPAM 0.5 MG PO TABS: 0.5000 mg | ORAL_TABLET | Freq: Two times a day (BID) | ORAL | 0 refills | Status: DC | PRN

## 2018-12-23 MED ORDER — ZIPRASIDONE HCL 40 MG PO CAPS: 40.0000 mg | ORAL_CAPSULE | Freq: Two times a day (BID) | ORAL | 0 refills | Status: DC

## 2018-12-23 MED ORDER — MIRTAZAPINE 7.5 MG PO TABS: 7.5000 mg | ORAL_TABLET | Freq: Every day | ORAL | 0 refills | Status: DC
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
## 2018-12-29 ENCOUNTER — Other Ambulatory Visit: Payer: Self-pay

## 2018-12-29 ENCOUNTER — Encounter (HOSPITAL_COMMUNITY): Payer: Self-pay | Admitting: Psychiatry

## 2018-12-29 ENCOUNTER — Ambulatory Visit (INDEPENDENT_AMBULATORY_CARE_PROVIDER_SITE_OTHER): Payer: Medicare PPO | Admitting: Psychiatry

## 2018-12-29 ENCOUNTER — Other Ambulatory Visit (HOSPITAL_COMMUNITY): Payer: Self-pay

## 2018-12-29 DIAGNOSIS — F411 Generalized anxiety disorder: Secondary | ICD-10-CM

## 2018-12-29 DIAGNOSIS — F250 Schizoaffective disorder, bipolar type: Secondary | ICD-10-CM
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
## 2019-01-06 ENCOUNTER — Other Ambulatory Visit: Payer: Self-pay

## 2019-01-06 ENCOUNTER — Ambulatory Visit (INDEPENDENT_AMBULATORY_CARE_PROVIDER_SITE_OTHER): Payer: Medicare PPO | Admitting: Psychiatry

## 2019-01-06 ENCOUNTER — Encounter (HOSPITAL_COMMUNITY): Payer: Self-pay | Admitting: Psychiatry

## 2019-01-06 DIAGNOSIS — F411 Generalized anxiety disorder: Secondary | ICD-10-CM | POA: Diagnosis not present

## 2019-01-06 DIAGNOSIS — F250 Schizoaffective disorder, bipolar type: Secondary | ICD-10-CM | POA: Diagnosis not present

## 2019-01-06 NOTE — Progress Notes (Signed)
Virtual Visit via Video Note  I connected with Andrea Bean on 01/06/19 at  2:30 PM EDT by a video enabled telemedicine application and verified that I am speaking with the correct person using two identifiers.   I discussed the limitations of evaluation and management by telemedicine and the availability of in person appointments. The patient expressed understanding and agreed to proceed.  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    Observations/Objective: Counselor met with Andrea Bean and her husband Andrea Bean for family therapy via Webex. Counselor praised Andrea Bean for ability to engage in therapy without letting her anxiety consume her. Counselor assessed psychiatric symptoms and life stressors. Andrea Bean admitted to being in an anxious state with pressured speech and racing thoughts. She shared that her mother-in-law passed from COVID-37 on Friday and that she and her family are grieving her loss heavily. Andrea Bean shared that her paranoia has increased with her concern of other loved ones, like her children and grandchild contracting the virus. Counselor assessed her support system and current coping strategies. We discussed utilization of healthy coping skills and communication with her family to prevent getting in a "dark" place again like last month. Andrea Bean was able to identify helpful strategies with the support of her husband. Counselor ended session with allowing them both to talk about the loss of their mother/mother-in-law and encouraged the grieving process.   Assessment and Plan: Andrea Bean will contact [facility] if her symptoms worsen to discuss medication options. Andrea Bean will follow safety plan and lean on her support people to manage mental health.  Follow Up Instructions: Counselor will set up next webex for 2 weeks.    I discussed the assessment and treatment plan with the patient. The patient was provided an  opportunity to ask questions and all were answered. The patient agreed with the plan and demonstrated an understanding of the instructions.   The patient was advised to call back or seek an in-person evaluation if the symptoms worsen or if the condition fails to improve as anticipated.  I provided 50 minutes of non-face-to-face time during this encounter.   Lise Auer, LCSW

## 2019-01-12 ENCOUNTER — Ambulatory Visit (HOSPITAL_COMMUNITY): Payer: Medicare PPO | Admitting: Psychiatry

## 2019-01-12 ENCOUNTER — Other Ambulatory Visit: Payer: Self-pay

## 2019-01-12 NOTE — Progress Notes (Deleted)
Patient ID: Andrea Bean, female   DOB: [birth year], 58 y.o.   MRN: [id number] 01/12/2019 at 1, 35. PM Attempted to reach patient at available phone number ( x 2 -15 minutes apart)  for phone based medication management follow up. No answer.  Sallyanne Havers MD

## 2019-01-19 ENCOUNTER — Other Ambulatory Visit: Payer: Self-pay

## 2019-01-19 ENCOUNTER — Encounter (HOSPITAL_COMMUNITY): Payer: Self-pay | Admitting: Psychiatry

## 2019-01-19 ENCOUNTER — Ambulatory Visit (INDEPENDENT_AMBULATORY_CARE_PROVIDER_SITE_OTHER): Payer: Medicare PPO | Admitting: Psychiatry

## 2019-01-19 DIAGNOSIS — F250 Schizoaffective disorder, bipolar type: Secondary | ICD-10-CM

## 2019-01-19 DIAGNOSIS — F411 Generalized anxiety disorder: Secondary | ICD-10-CM

## 2019-01-19 NOTE — Progress Notes (Signed)
Virtual Visit via Video Note  I connected with Marzetta Board on 01/19/19 at  3:30 PM EDT by a video enabled telemedicine application and verified that I am speaking with the correct person using two identifiers.   I discussed the limitations of evaluation and management by telemedicine and the availability of in person appointments. The patient expressed understanding and agreed to proceed.  History of Present Illness: Schizoaffective Disorder, bipolar type and generalized anxiety disorder due to adverse life events.   Observations/Objective: Counselor met with Santiago Glad via [employer] for individual therapy. Counselor assess mental health status and symptoms. Milani reported that she was experiencing extremely high blood pressure, which she was seen for today at another practice. Latroya reported episodes of depression and continuous anxiety since our last session. Counselor and Stephaniemarie discussed the impacts of anxiety on health and visa versa. Counselor processed strategies to reduce Dorla's stress levels. Counselor and Riah discussed the loss of her mother-in-law and how that has been impacting her and her husband, causing sadness and depression to increase. Ayanni reported having difficulties in making her last appointment with the psychiatrist to review her medications. Counselor to follow up to determine issue. Counselor encouraged Kewana to focus on reducing anxiety levels, resting and taking basic care of herself.    Assessment and Plan: Counselor communicated with psychiatrist/nurse about communicating with Elna to reset her appointment. Counselor will meet with Sakeenah in 2 weeks to follow up on treatment plan goals. Tresia will utilize coping strategies and safety plan to reduce anxiety and risk of harm.   Follow Up Instructions: Counselor will send Webex link for next session.   I discussed the assessment and treatment plan with the patient. The patient was provided an opportunity to ask questions  and all were answered. The patient agreed with the plan and demonstrated an understanding of the instructions.   The patient was advised to call back or seek an in-person evaluation if the symptoms worsen or if the condition fails to improve as anticipated.  I provided 38 minutes of non-face-to-face time during this encounter.   Lise Auer, LCSW

## 2019-01-26 ENCOUNTER — Other Ambulatory Visit: Payer: Self-pay

## 2019-01-26 ENCOUNTER — Ambulatory Visit (HOSPITAL_COMMUNITY): Payer: Medicare PPO | Admitting: Psychiatry

## 2019-01-26 ENCOUNTER — Ambulatory Visit (INDEPENDENT_AMBULATORY_CARE_PROVIDER_SITE_OTHER): Payer: Medicare PPO | Admitting: Psychiatry

## 2019-01-26 DIAGNOSIS — F250 Schizoaffective disorder, bipolar type: Secondary | ICD-10-CM

## 2019-01-26 MED ORDER — CLONAZEPAM 0.5 MG PO TABS: 0.5000 mg | ORAL_TABLET | Freq: Two times a day (BID) | ORAL | 1 refills | Status: DC | PRN

## 2019-01-26 MED ORDER — MIRTAZAPINE 7.5 MG PO TABS: 7.5000 mg | ORAL_TABLET | Freq: Every day | ORAL | 0 refills | Status: DC

## 2019-01-26 MED ORDER — ZIPRASIDONE HCL 40 MG PO CAPS: 40.0000 mg | ORAL_CAPSULE | Freq: Two times a day (BID) | ORAL | 0 refills | Status: DC

## 2019-01-26 NOTE — Progress Notes (Signed)
BH MD/PA/NP OP Progress Note  01/26/2019 10:26 AM Andrea Bean  MRN:  [id number]  Chief Complaint: Medication management appointment HPI: This medication management follow-up assessment was done via phone conversation due to coronavirus epidemic restrictions.  Patient is a 58 year old married female, who carries a diagnosis of schizoaffective disorder.  She was recently admitted to the inpatient psychiatric unit on December 05, 2018 due to worsening depression and a suicide attempt by overdosing as well as paranoid ideations. Patient reports that she is doing better, although describes some lingering depression and low energy level.  She denies any suicidal ideations.  She denies hallucinations.  She states she has been feeling less guarded and less paranoid, which she attributes in part to being at home with her immediate family who she trusts (husband, adult children) and not going out as much or interacting with other people as much due to the coronavirus epidemic restrictions.   She denies medication side effects and states that she is tolerating medications well.  She feels her medications are helping.  Describes appetite as improving, energy level as low, sleep is also improved.  *Reports she recently saw her PCP, who adjusted her antihypertensive medications, and who recommended that she remain at home during this epidemic.  Of note, states that her mother-in-law died about a month ago from complications of COVID infection. She has been following up with [facility] for psychotherapy.  Visit Diagnosis: Schizoaffective disorder by history Past Psychiatric History: Schizoaffective disorder by history, recent psychiatric admission in March 2020.  Past Medical History:  Past Medical History:  Diagnosis Date  . Anxiety   . Bipolar disorder (HCC)   . Hypercholesteremia   . Hypertension   . Right foot drop 01/27/2013    Past Surgical History:  Procedure Laterality Date  . BREAST CYST  EXCISION Right   . BREAST SURGERY Right    "duct removal"  . HIP SURGERY Right 1998   3 screws  . PARTIAL HYSTERECTOMY      Family Psychiatric History:   Family History:  Family History  Problem Relation Age of Onset  . Seizures Mother   . Hypertension Maternal Grandfather   . Diabetes Paternal Grandmother   . Hypertension Paternal Grandfather     Social History:  Social History   Socioeconomic History  . Marital status: Married    Spouse name: Not on file  . Number of children: Not on file  . Years of education: Not on file  . Highest education level: Not on file  Occupational History  . Occupation: disabled  Social Needs  . Financial resource strain: Not on file  . Food insecurity:    Worry: Not on file    Inability: Not on file  . Transportation needs:    Medical: Not on file    Non-medical: Not on file  Tobacco Use  . Smoking status: Never Smoker  . Smokeless tobacco: Never Used  Substance and Sexual Activity  . Alcohol use: No  . Drug use: No  . Sexual activity: Not on file  Lifestyle  . Physical activity:    Days per week: Not on file    Minutes per session: Not on file  . Stress: Not on file  Relationships  . Social connections:    Talks on phone: Not on file    Gets together: Not on file    Attends religious service: Not on file    Active member of club or organization: Not on file  Attends meetings of clubs or organizations: Not on file    Relationship status: Not on file  Other Topics Concern  . Not on file  Social History Narrative  . Not on file    Allergies: No Known Allergies  Metabolic Disorder Labs: Lab Results  Component Value Date   HGBA1C  11/20/2009    6.1 (NOTE) The ADA recommends the following therapeutic goal for glycemic control related to Hgb A1c measurement: Goal of therapy: <6.5 Hgb A1c  Reference: American Diabetes Association: Clinical Practice Recommendations 2010, Diabetes Care, 2010, 33: (Suppl  1).   MPG 128  11/20/2009   No results found for: PROLACTIN Lab Results  Component Value Date   CHOL (H) 11/20/2009    242        ATP III CLASSIFICATION:  <200     mg/dL   Desirable  865-784  mg/dL   Borderline High  >=696    mg/dL   High          TRIG 295 (H) 11/20/2009   HDL 46 11/20/2009   CHOLHDL 5.3 11/20/2009   VLDL 44 (H) 11/20/2009   LDLCALC (H) 11/20/2009    152        Total Cholesterol/HDL:CHD Risk Coronary Heart Disease Risk Table                     Men   Women  1/2 Average Risk   3.4   3.3  Average Risk       5.0   4.4  2 X Average Risk   9.6   7.1  3 X Average Risk  23.4   11.0        Use the calculated Patient Ratio above and the CHD Risk Table to determine the patient's CHD Risk.        ATP III CLASSIFICATION (LDL):  <100     mg/dL   Optimal  284-132  mg/dL   Near or Above                    Optimal  130-159  mg/dL   Borderline  440-102  mg/dL   High  >725     mg/dL   Very High     Therapeutic Level Labs: No results found for: LITHIUM No results found for: VALPROATE No components found for:  CBMZ  Current Medications: Current Outpatient Medications  Medication Sig Dispense Refill  . acetaminophen (TYLENOL) 325 MG tablet Take 2 tablets (650 mg total) by mouth every 6 (six) hours as needed for mild pain.    Marland Kitchen atorvastatin (LIPITOR) 40 MG tablet Take 40 mg by mouth daily.    . clonazePAM (KLONOPIN) 0.5 MG tablet Take 1 tablet (0.5 mg total) by mouth 2 (two) times daily as needed (anxiety). 20 tablet 0  . linaclotide (LINZESS) 290 MCG CAPS capsule Take 290 mcg by mouth daily before breakfast.    . lisinopril (PRINIVIL,ZESTRIL) 10 MG tablet Take 1 tablet (10 mg total) by mouth daily. For high blood pressure 30 tablet 0  . metoprolol tartrate (LOPRESSOR) 25 MG tablet Take 1 tablet (25 mg total) by mouth 2 (two) times daily. For high blood pressure 60 tablet 0  . mirtazapine (REMERON) 7.5 MG tablet Take 1 tablet (7.5 mg total) by mouth at bedtime. 30 tablet 0  .  omeprazole (PRILOSEC) 20 MG capsule Take 20 mg by mouth daily.    . ziprasidone (GEODON) 40 MG capsule Take 1 capsule (40 mg  total) by mouth 2 (two) times daily with a meal. For mood 60 capsule 0   No current facility-administered medications for this visit.      Musculoskeletal: NA- appointment via phone   Psychiatric Specialty Exam: Please take into account limitations inherent to phone communication ROS denies chest pain or shortness of breath  There were no vitals taken for this visit.There is no height or weight on file to calculate BMI.  General Appearance: NA  Eye Contact:  NA  Speech:  Normal Rate  Volume:  Normal  Mood:  Reports partially improved mood  Affect:  diffcult to assess, states she is feeling partially better  Thought Process:  Linear and Descriptions of Associations: Intact  Orientation:  Other:  Fully alert and attentive  Thought Content: Currently denies hallucinations, and reports that she has been feeling less "paranoid".  No delusions are expressed, thought processes linear   Suicidal Thoughts:  No denies suicidal or self-injurious ideations and identifies love for her family as a protective factor  Homicidal Thoughts:  No.  Memory:  Recent and remote grossly intact  Judgement:  Other:  Present  Insight:  Present  Psychomotor Activity:  NA  Concentration:  Concentration: Good and Attention Span: Good  Recall:  Good  Fund of Knowledge: Good  Language: Good  Akathisia:  NA- does not report any abnormal movements or psychomotor agitation  Handed:  Right  AIMS (if indicated): AIMS test not done, this was phone session  Assets:  Communication Skills Desire for Improvement Resilience  ADL's:  Intact  Cognition: WNL  Sleep:  reports improved sleep   Screenings: AIMS     [facility]  AIMS Total Score  0  (Pended)     AUDIT     [facility]  Alcohol Use Disorder Identification Test Final Score (AUDIT)  0       Assessment and Plan:  58 year old female, history of schizoaffective disorder, recent psychiatric admission in March for depression and suicidal attempt by overdosing. This follow-up done via phone due to coronavirus epidemic related restrictions. Patient reports partial improvement, denies suicidal ideations, describes improved self-referential/paranoid ideations and denies hallucinations.  No delusions are noted, no thought disorder is noted.  She repeats feeling more comfortable and less guarded to being restricted at home with her immediate family whom she trusts and feeling she is in a safe environment.  She feels medications are working for her and denies side effects.  Of note an EKG had been ordered during her recent visit, as on Geodon.  States that she is currently on "waiting list" for EKG. We have reviewed side effect profile.  Regarding Klonopin states she is taking between once and twice a day, denies any misuse or abuse. Patient states she is running low on medications and is requesting a refill. We will refill medications as follows Remeron 7.5 mg nightly x 1 month Geodon 40 mg twice daily x 1 month Klonopin 0.5 mg every 12 hours PRN for anxiety ( #14- 1 refill) We will follow-up  in 1 month-she agrees to contact clinic sooner should to be any worsening or concern prior.  Encouraged to follow-up with EKG request.  Continue outpatient psychotherapy with Ms. Morris.   Craige CottaFernando A Olla Delancey, MD 01/26/2019, 10:26 AM

## 2019-01-26 NOTE — Addendum Note (Signed)
Addended by: Nehemiah Massed A on: 01/26/2019 03:16 PM   Modules accepted: Orders

## 2019-02-02 ENCOUNTER — Ambulatory Visit (INDEPENDENT_AMBULATORY_CARE_PROVIDER_SITE_OTHER): Payer: Medicare PPO | Admitting: Psychiatry

## 2019-02-02 ENCOUNTER — Encounter (HOSPITAL_COMMUNITY): Payer: Self-pay | Admitting: Psychiatry

## 2019-02-02 ENCOUNTER — Other Ambulatory Visit: Payer: Self-pay

## 2019-02-02 DIAGNOSIS — F250 Schizoaffective disorder, bipolar type: Secondary | ICD-10-CM | POA: Diagnosis not present

## 2019-02-02 DIAGNOSIS — F411 Generalized anxiety disorder: Secondary | ICD-10-CM
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
## 2019-02-09 ENCOUNTER — Telehealth (HOSPITAL_COMMUNITY): Payer: Self-pay

## 2019-02-09 MED ORDER — CLONAZEPAM 0.5 MG PO TABS: 0.5000 mg | ORAL_TABLET | Freq: Two times a day (BID) | ORAL | 0 refills | Status: DC | PRN

## 2019-02-09 NOTE — Telephone Encounter (Signed)
Patient called requesting refills on her medications. Stated she is having a hard time sleeping and having paranoid thoughts. She and husband don't know what to do. Patient stated she has 2 clonazepam 0.5 mg left and will run out of her other medications before her scheduled appointment on 03/03/19. Spoke with Dr. Jama Flavors and he authorized #30 with 1 refill. Called in to pharmacy.

## 2019-02-16 ENCOUNTER — Other Ambulatory Visit: Payer: Self-pay

## 2019-02-16 ENCOUNTER — Encounter (HOSPITAL_COMMUNITY): Payer: Self-pay | Admitting: Psychiatry

## 2019-02-16 ENCOUNTER — Ambulatory Visit (INDEPENDENT_AMBULATORY_CARE_PROVIDER_SITE_OTHER): Payer: Medicare PPO | Admitting: Psychiatry

## 2019-02-16 DIAGNOSIS — F250 Schizoaffective disorder, bipolar type: Secondary | ICD-10-CM | POA: Diagnosis not present

## 2019-02-16 DIAGNOSIS — F411 Generalized anxiety disorder: Secondary | ICD-10-CM | POA: Diagnosis not present

## 2019-02-16 NOTE — Progress Notes (Signed)
Virtual Visit via Telephone Note  I connected with Andrea Bean on 02/16/19 at  3:30 PM EDT by telephone and verified that I am speaking with the correct person using two identifiers.  Location: Patient: Andrea Bean Provider: Lise Auer, LCSW   I discussed the limitations, risks, security and privacy concerns of performing an evaluation and management service by telephone and the availability of in person appointments. I also discussed with the patient that there may be a patient responsible charge related to this service. The patient expressed understanding and agreed to proceed.   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   Observations/Objective: Counselor met with Andrea Bean for individual therapy via Webex. Counselor assessed MH symptoms and progress on treatment plan goals. Andrea Bean denied suicidal ideation or self-harm behaviors. Andrea Bean reported that she had another episode of 2 day insomnia, increased paranoia, racing thoughts, and increased anxiety, last week. Counselor reviewed her safety plan and how her and her husband decided to treat the symptoms. Counselor review helpful coping skills for her to implement in preventing and managing future episodes. Counselor processed annual grief and loss issues she's experiencing due to the passing of her son at the age of 40 in May. Andrea Bean shared the challenges of Mother's day and his death date and how it continues to impact her and her family. Counselor verbalized that this is a safe space to share troubling thoughts and feelings to reassure Andrea Bean of her utilization of treatment.   Assessment and Plan: Counselor will continue to meet with Andrea Bean to address treatment plan goals. Andrea Bean will continue to follow recommendations of providers and implement skills learned in session.  Follow Up Instructions: Counselor will send information for next session via Webex.     I  discussed the assessment and treatment plan with the patient. The patient was provided an opportunity to ask questions and all were answered. The patient agreed with the plan and demonstrated an understanding of the instructions.   The patient was advised to call back or seek an in-person evaluation if the symptoms worsen or if the condition fails to improve as anticipated.  I provided 53 minutes of non-face-to-face time during this encounter.   Lise Auer, LCSW

## 2019-03-02 ENCOUNTER — Other Ambulatory Visit: Payer: Self-pay

## 2019-03-02 ENCOUNTER — Ambulatory Visit (INDEPENDENT_AMBULATORY_CARE_PROVIDER_SITE_OTHER): Payer: Medicare PPO | Admitting: Psychiatry

## 2019-03-02 DIAGNOSIS — F411 Generalized anxiety disorder: Secondary | ICD-10-CM | POA: Diagnosis not present

## 2019-03-02 DIAGNOSIS — F250 Schizoaffective disorder, bipolar type: Secondary | ICD-10-CM | POA: Diagnosis not present

## 2019-03-03 ENCOUNTER — Encounter (HOSPITAL_COMMUNITY): Payer: Self-pay | Admitting: Psychiatry

## 2019-03-03 ENCOUNTER — Other Ambulatory Visit (HOSPITAL_COMMUNITY): Payer: Self-pay

## 2019-03-03 ENCOUNTER — Other Ambulatory Visit: Payer: Self-pay

## 2019-03-03 ENCOUNTER — Ambulatory Visit (INDEPENDENT_AMBULATORY_CARE_PROVIDER_SITE_OTHER): Payer: Medicare PPO | Admitting: Psychiatry

## 2019-03-03 DIAGNOSIS — F259 Schizoaffective disorder, unspecified: Secondary | ICD-10-CM | POA: Diagnosis not present

## 2019-03-03 MED ORDER — ZIPRASIDONE HCL 40 MG PO CAPS: 40.0000 mg | ORAL_CAPSULE | Freq: Two times a day (BID) | ORAL | 1 refills | Status: DC

## 2019-03-03 MED ORDER — MIRTAZAPINE 15 MG PO TABS: 15.0000 mg | ORAL_TABLET | Freq: Every day | ORAL | 1 refills | Status: DC

## 2019-03-03 MED ORDER — CLONAZEPAM 0.5 MG PO TABS: 0.5000 mg | ORAL_TABLET | Freq: Two times a day (BID) | ORAL | 0 refills | Status: DC | PRN

## 2019-03-03 NOTE — Progress Notes (Signed)
BH MD/PA/NP OP Progress Note  03/03/2019 12:30 PM Andrea Bean  MRN:  [id number]  Chief Complaint: medication management appointment. Via phone . HPI: This appointment was done via phone conversation due to coronavirus epidemic restrictions.  I have reviewed with patient the limitations associated with said method of contact. 58 year old female, history of schizoaffective disorder. Follow-up appointment for medication management.  Patient reports she has been relatively stable although still tending to feel "sad", and endorsing some anxiety.  She describes a long history of "paranoia", which she describes mainly as a sense of distrust, particularly of strangers or people she does not know well.  She states she sometimes feels she has been watched or monitored but does not currently express or endorse any formed delusional ideations.  She describes a history of intermittent hallucinations but currently denies.  Taking into account the limitations of phone communication her thought process seems linear and well organized at this time. She reports she recently saw her PCP for medical management (has history of hypercholesterolemia and hypertension). She denies current medication side effects.  She is on Remeron, Geodon, Klonopin PRN for anxiety. She denies any suicidal ideations.  Visit Diagnosis: Schizoaffective disorder by history Past Psychiatric History:   Past Medical History:  Past Medical History:  Diagnosis Date  . Anxiety   . Bipolar disorder (HCC)   . Hypercholesteremia   . Hypertension   . Right foot drop 01/27/2013    Past Surgical History:  Procedure Laterality Date  . BREAST CYST EXCISION Right   . BREAST SURGERY Right    "duct removal"  . HIP SURGERY Right 1998   3 screws  . PARTIAL HYSTERECTOMY      Family Psychiatric History:   Family History:  Family History  Problem Relation Age of Onset  . Seizures Mother   . Hypertension Maternal Grandfather   . Diabetes  Paternal Grandmother   . Hypertension Paternal Grandfather     Social History:  Social History   Socioeconomic History  . Marital status: Married    Spouse name: Not on file  . Number of children: Not on file  . Years of education: Not on file  . Highest education level: Not on file  Occupational History  . Occupation: disabled  Social Needs  . Financial resource strain: Not on file  . Food insecurity:    Worry: Not on file    Inability: Not on file  . Transportation needs:    Medical: Not on file    Non-medical: Not on file  Tobacco Use  . Smoking status: Never Smoker  . Smokeless tobacco: Never Used  Substance and Sexual Activity  . Alcohol use: No  . Drug use: No  . Sexual activity: Not on file  Lifestyle  . Physical activity:    Days per week: Not on file    Minutes per session: Not on file  . Stress: Not on file  Relationships  . Social connections:    Talks on phone: Not on file    Gets together: Not on file    Attends religious service: Not on file    Active member of club or organization: Not on file    Attends meetings of clubs or organizations: Not on file    Relationship status: Not on file  Other Topics Concern  . Not on file  Social History Narrative  . Not on file    Allergies: No Known Allergies  Metabolic Disorder Labs: Lab Results  Component Value Date  HGBA1C  11/20/2009    6.1 (NOTE) The ADA recommends the following therapeutic goal for glycemic control related to Hgb A1c measurement: Goal of therapy: <6.5 Hgb A1c  Reference: American Diabetes Association: Clinical Practice Recommendations 2010, Diabetes Care, 2010, 33: (Suppl  1).   MPG 128 11/20/2009   No results found for: PROLACTIN Lab Results  Component Value Date   CHOL (H) 11/20/2009    242        ATP III CLASSIFICATION:  <200     mg/dL   Desirable  256-389  mg/dL   Borderline High  >=373    mg/dL   High          TRIG 428 (H) 11/20/2009   HDL 46 11/20/2009   CHOLHDL  5.3 11/20/2009   VLDL 44 (H) 11/20/2009   LDLCALC (H) 11/20/2009    152        Total Cholesterol/HDL:CHD Risk Coronary Heart Disease Risk Table                     Men   Women  1/2 Average Risk   3.4   3.3  Average Risk       5.0   4.4  2 X Average Risk   9.6   7.1  3 X Average Risk  23.4   11.0        Use the calculated Patient Ratio above and the CHD Risk Table to determine the patient's CHD Risk.        ATP III CLASSIFICATION (LDL):  <100     mg/dL   Optimal  768-115  mg/dL   Near or Above                    Optimal  130-159  mg/dL   Borderline  726-203  mg/dL   High  >559     mg/dL   Very High     Therapeutic Level Labs: No results found for: LITHIUM No results found for: VALPROATE No components found for:  CBMZ  Current Medications: Current Outpatient Medications  Medication Sig Dispense Refill  . acetaminophen (TYLENOL) 325 MG tablet Take 2 tablets (650 mg total) by mouth every 6 (six) hours as needed for mild pain.    Marland Kitchen atorvastatin (LIPITOR) 40 MG tablet Take 40 mg by mouth daily.    . clonazePAM (KLONOPIN) 0.5 MG tablet Take 1 tablet (0.5 mg total) by mouth 2 (two) times daily as needed (anxiety). 42 tablet 0  . linaclotide (LINZESS) 290 MCG CAPS capsule Take 290 mcg by mouth daily before breakfast.    . lisinopril (PRINIVIL,ZESTRIL) 10 MG tablet Take 1 tablet (10 mg total) by mouth daily. For high blood pressure 30 tablet 0  . metoprolol tartrate (LOPRESSOR) 25 MG tablet Take 1 tablet (25 mg total) by mouth 2 (two) times daily. For high blood pressure 60 tablet 0  . mirtazapine (REMERON) 7.5 MG tablet Take 1 tablet (7.5 mg total) by mouth at bedtime. 30 tablet 0  . omeprazole (PRILOSEC) 20 MG capsule Take 20 mg by mouth daily.    . ziprasidone (GEODON) 40 MG capsule Take 1 capsule (40 mg total) by mouth 2 (two) times daily with a meal. For mood 60 capsule 0   No current facility-administered medications for this visit.      Musculoskeletal: Cannot be  determined at this time based on phone visit communication limitation.  Psychiatric Specialty Exam: Please take into account inherent limitations of phone  visit regarding obtaining a complete MSE ROS denies chest pain or shortness of breath, denies cough  There were no vitals taken for this visit.There is no height or weight on file to calculate BMI.  General Appearance: NA  Eye Contact:  NA  Speech:  Normal Rate  Volume:  Normal  Mood:  Reports some lingering depression  Affect:  NA  Thought Process:  Linear and Descriptions of Associations: Intact  Orientation:  Other:  Appears fully alert, attentive and fully oriented insofar as can be determined by phone communication  Thought Content: Reports chronic vague feelings of paranoia and mistrust as described above.  Currently denies auditory hallucinations.  No delusions were expressed   Suicidal Thoughts:  No denies suicidal or self-injurious ideations  Homicidal Thoughts:  No  Memory:  Recent and remote grossly intact  Judgement:  Other:  Present  Insight:  Present  Psychomotor Activity:  NA  Concentration:  Concentration: Good and Attention Span: Good  Recall:  Good  Fund of Knowledge: Good  Language: Good  Akathisia:  Negative  Handed:  Right  AIMS (if indicated): AIMS test not done today  Assets:  Resilience Social Support  ADL's:  NA  Cognition: WNL  Sleep:  Fair   Screenings: AIMS     [facility]  AIMS Total Score  0  (Pended)     AUDIT     Ad[facility]  Alcohol Use Disorder Identification Test Final Score (AUDIT)  0       Assessment and Plan: 58 year old married female, history of schizoaffective disorder, had been admitted to inpatient unit on December 05, 2018 for worsening depression, paranoia, suicidal thoughts. At present reports some lingering depression as well as  some persistent vague paranoia which she describes mostly as a vague feeling of mistrust and caution.  Does not currently endorse hallucinations, does not appear thought disordered, and no actual delusions were noted or expressed.. She is tolerating medications well, denies side effects.  We discussed options.  Agrees to increase Remeron for further antidepressant management.  For now we will continue Geodon at same dose.  She will also continue Klonopin at 0.5 mg every 12 hours as needed for anxiety.  We have reviewed side effects.  Patient is aware of potential for sedation and of the abuse potential related to benzodiazepines.  During last visit an EKG (routine as on Geodon) was ordered.  Patient states she has not been able to do it yet due to coronavirus restrictions.  Most recent EKG is from December 05, 2018-QTc 476.  Geodon 40 mg twice daily Increase Remeron to 15 mg nightly Continue Klonopin 0.5 mg every 12 hours as needed. We will see in 1 month, patient agrees to contact me sooner should there be any worsening prior.    Craige CottaFernando A Cobos, MD 03/03/2019, 12:30 PM
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
## 2019-03-16 ENCOUNTER — Ambulatory Visit (HOSPITAL_COMMUNITY): Payer: Medicare PPO | Admitting: Psychiatry

## 2019-03-25 ENCOUNTER — Other Ambulatory Visit (HOSPITAL_COMMUNITY): Payer: Self-pay

## 2019-03-25 MED ORDER — CLONAZEPAM 0.5 MG PO TABS: 0.5000 mg | ORAL_TABLET | Freq: Two times a day (BID) | ORAL | 0 refills | Status: DC | PRN

## 2019-03-30 ENCOUNTER — Ambulatory Visit (HOSPITAL_COMMUNITY): Payer: Medicare PPO | Admitting: Psychiatry

## 2019-03-30 ENCOUNTER — Other Ambulatory Visit: Payer: Self-pay

## 2019-04-21 ENCOUNTER — Other Ambulatory Visit: Payer: Self-pay

## 2019-04-21 ENCOUNTER — Ambulatory Visit (INDEPENDENT_AMBULATORY_CARE_PROVIDER_SITE_OTHER): Payer: Medicare PPO | Admitting: Psychiatry

## 2019-04-21 ENCOUNTER — Encounter (HOSPITAL_COMMUNITY): Payer: Self-pay | Admitting: Psychiatry

## 2019-04-21 DIAGNOSIS — F411 Generalized anxiety disorder: Secondary | ICD-10-CM

## 2019-04-21 DIAGNOSIS — F259 Schizoaffective disorder, unspecified: Secondary | ICD-10-CM | POA: Diagnosis not present

## 2019-04-21 NOTE — Progress Notes (Signed)
Virtual Visit via Video Note  I connected with Andrea Bean on 04/21/19 at  2:00 PM EDT by a video enabled telemedicine application and verified that I am speaking with the correct person using two identifiers.  Location: Patient: Andrea Bean Provider: Lise Auer, LCSW   I discussed the limitations of evaluation and management by telemedicine and the availability of in person appointments. The patient expressed understanding and agreed to proceed.  History of Present Illness: Schizoeffective Disorder and GAD   Observations/Objective: Social worker met with Andrea Bean for individual therapy via Webex. Counselor assessed MH symptoms and progress on treatment plan goals. Andrea Bean denied suicidal ideation or self-harm behaviors. Andrea Bean that her main concern is her medications and the lack of communication from her psychiatrist about her medications. Counselor took note of the current issues and reached out to the medical providers to follow up with her. We identified that her stabilization is dependent on consistent and routine medication management. Andrea Bean Bean that her paranoia, anxiety, isolating and depression has increased, impacting her sleep, daily functioning and her relationships with her family members. Andrea Bean brought on her husband to share his observations, which validated her report. Andrea Bean Bean that she is also experiencing additional concerns related to her daughter giving birth next month, being concerned with her health, the babies health and risk for COVID exposure, since they have already lost one family member to [place] this year. Counselor reminded Andrea Bean of daily healthy habits, coping strategies and to follow up about her medications if she has not heard from the provider in a day, before ending the session.    Assessment and Plan: Counselor will continue to meet with Andrea Bean to address treatment plan goals. [name] will continue to follow recommendations of providers and  implement skills learned in session.  Follow Up Instructions: Counselor will send information for next session via Webex.     I discussed the assessment and treatment plan with the patient. The patient was provided an opportunity to ask questions and all were answered. The patient agreed with the plan and demonstrated an understanding of the instructions.   The patient was advised to call back or seek an in-person evaluation if the symptoms worsen or if the condition fails to improve as anticipated.  I provided 55 minutes of non-face-to-face time during this encounter.   Lise Auer, LCSW

## 2019-04-23 ENCOUNTER — Other Ambulatory Visit (HOSPITAL_COMMUNITY): Payer: Self-pay

## 2019-04-28 ENCOUNTER — Other Ambulatory Visit (HOSPITAL_COMMUNITY): Payer: Self-pay

## 2019-04-28 ENCOUNTER — Ambulatory Visit (HOSPITAL_COMMUNITY): Payer: Medicare PPO | Admitting: Psychiatry

## 2019-04-28 MED ORDER — ZIPRASIDONE HCL 40 MG PO CAPS: 40.0000 mg | ORAL_CAPSULE | Freq: Two times a day (BID) | ORAL | 0 refills | Status: DC

## 2019-04-28 MED ORDER — MIRTAZAPINE 15 MG PO TABS: 15.0000 mg | ORAL_TABLET | Freq: Every day | ORAL | 0 refills | Status: DC

## 2019-04-28 MED ORDER — CLONAZEPAM 0.5 MG PO TABS: 0.5000 mg | ORAL_TABLET | Freq: Two times a day (BID) | ORAL | 0 refills | Status: DC | PRN

## 2019-05-12 ENCOUNTER — Other Ambulatory Visit: Payer: Self-pay

## 2019-05-12 ENCOUNTER — Ambulatory Visit (INDEPENDENT_AMBULATORY_CARE_PROVIDER_SITE_OTHER): Payer: Medicare PPO | Admitting: Psychiatry

## 2019-05-12 ENCOUNTER — Encounter (HOSPITAL_COMMUNITY): Payer: Self-pay | Admitting: Psychiatry

## 2019-05-12 DIAGNOSIS — F259 Schizoaffective disorder, unspecified: Secondary | ICD-10-CM

## 2019-05-12 MED ORDER — ZIPRASIDONE HCL 40 MG PO CAPS: 40.0000 mg | ORAL_CAPSULE | Freq: Two times a day (BID) | ORAL | 0 refills | Status: DC

## 2019-05-12 MED ORDER — CLONAZEPAM 0.5 MG PO TABS: 0.5000 mg | ORAL_TABLET | Freq: Two times a day (BID) | ORAL | 0 refills | Status: DC | PRN

## 2019-05-12 MED ORDER — MIRTAZAPINE 30 MG PO TABS: 30.0000 mg | ORAL_TABLET | Freq: Every day | ORAL | 1 refills | Status: DC

## 2019-05-12 NOTE — Progress Notes (Signed)
BH MD/PA/NP OP Progress Note  05/12/2019 11:36 AM Andrea Bean  MRN:  [id number]  Chief Complaint: Medication management appointment HPI: This appointment was via phone communication.  Limitations regarding this form of communication reviewed and patient verified using 2 different identifiers.  58 year old married female, history of schizoaffective disorder. She reports partial improvement compared to prior but describes some lingering depression and a vague sense of paranoia/distrust, particularly when in public/not at home.  Denies hallucinations. At home she feels safer and she states she trusts her husband, whom she states reassures her that "everything is all right". Denies suicidal ideations and describes love for her husband and for her adult children as protective factor against hurting herself.  She also reports that her adult daughter just had a baby and is excited about eventually meeting her grandson (daughter lives in [place]) but frustrated that it cannot be done at this point due to the COVID epidemic. Denies medication side effects.   Visit Diagnosis: Schizoaffective disorder Past Psychiatric History:   Past Medical History:  Past Medical History:  Diagnosis Date  . Anxiety   . Bipolar disorder (HCC)   . Hypercholesteremia   . Hypertension   . Right foot drop 01/27/2013    Past Surgical History:  Procedure Laterality Date  . BREAST CYST EXCISION Right   . BREAST SURGERY Right    "duct removal"  . HIP SURGERY Right 1998   3 screws  . PARTIAL HYSTERECTOMY      Family Psychiatric History:   Family History:  Family History  Problem Relation Age of Onset  . Seizures Mother   . Hypertension Maternal Grandfather   . Diabetes Paternal Grandmother   . Hypertension Paternal Grandfather     Social History:  Social History   Socioeconomic History  . Marital status: Married    Spouse name: Not on file  . Number of children: Not on file  . Years of education: Not  on file  . Highest education level: Not on file  Occupational History  . Occupation: disabled  Social Needs  . Financial resource strain: Not on file  . Food insecurity    Worry: Not on file    Inability: Not on file  . Transportation needs    Medical: Not on file    Non-medical: Not on file  Tobacco Use  . Smoking status: Never Smoker  . Smokeless tobacco: Never Used  Substance and Sexual Activity  . Alcohol use: No  . Drug use: No  . Sexual activity: Not on file  Lifestyle  . Physical activity    Days per week: Not on file    Minutes per session: Not on file  . Stress: Not on file  Relationships  . Social Musicianconnections    Talks on phone: Not on file    Gets together: Not on file    Attends religious service: Not on file    Active member of club or organization: Not on file    Attends meetings of clubs or organizations: Not on file    Relationship status: Not on file  Other Topics Concern  . Not on file  Social History Narrative  . Not on file    Allergies: No Known Allergies  Metabolic Disorder Labs: Lab Results  Component Value Date   HGBA1C  11/20/2009    6.1 (NOTE) The ADA recommends the following therapeutic goal for glycemic control related to Hgb A1c measurement: Goal of therapy: <6.5 Hgb A1c  Reference: American Diabetes Association:  Clinical Practice Recommendations 2010, Diabetes Care, 2010, 33: (Suppl  1).   MPG 128 11/20/2009   No results found for: PROLACTIN Lab Results  Component Value Date   CHOL (H) 11/20/2009    242        ATP III CLASSIFICATION:  <200     mg/dL   Desirable  200-239  mg/dL   Borderline High  >=240    mg/dL   High          TRIG 222 (H) 11/20/2009   HDL 46 11/20/2009   CHOLHDL 5.3 11/20/2009   VLDL 44 (H) 11/20/2009   LDLCALC (H) 11/20/2009    152        Total Cholesterol/HDL:CHD Risk Coronary Heart Disease Risk Table                     Men   Women  1/2 Average Risk   3.4   3.3  Average Risk       5.0   4.4  2 X  Average Risk   9.6   7.1  3 X Average Risk  23.4   11.0        Use the calculated Patient Ratio above and the CHD Risk Table to determine the patient's CHD Risk.        ATP III CLASSIFICATION (LDL):  <100     mg/dL   Optimal  100-129  mg/dL   Near or Above                    Optimal  130-159  mg/dL   Borderline  160-189  mg/dL   High  >190     mg/dL   Very High     Therapeutic Level Labs: No results found for: LITHIUM No results found for: VALPROATE No components found for:  CBMZ  Current Medications: Current Outpatient Medications  Medication Sig Dispense Refill  . acetaminophen (TYLENOL) 325 MG tablet Take 2 tablets (650 mg total) by mouth every 6 (six) hours as needed for mild pain.    Marland Kitchen atorvastatin (LIPITOR) 40 MG tablet Take 40 mg by mouth daily.    . clonazePAM (KLONOPIN) 0.5 MG tablet Take 1 tablet (0.5 mg total) by mouth every 12 (twelve) hours as needed (anxiety). 14 tablet 0  . linaclotide (LINZESS) 290 MCG CAPS capsule Take 290 mcg by mouth daily before breakfast.    . lisinopril (PRINIVIL,ZESTRIL) 10 MG tablet Take 1 tablet (10 mg total) by mouth daily. For high blood pressure 30 tablet 0  . metoprolol tartrate (LOPRESSOR) 25 MG tablet Take 1 tablet (25 mg total) by mouth 2 (two) times daily. For high blood pressure 60 tablet 0  . mirtazapine (REMERON) 15 MG tablet Take 1 tablet (15 mg total) by mouth at bedtime. 30 tablet 0  . omeprazole (PRILOSEC) 20 MG capsule Take 20 mg by mouth daily.    . ziprasidone (GEODON) 40 MG capsule Take 1 capsule (40 mg total) by mouth 2 (two) times daily with a meal. For mood 60 capsule 0   No current facility-administered medications for this visit.       Psychiatric Specialty Exam: Please take into account limitations in obtaining a full mental status exam in the context of phone communication ROS  There were no vitals taken for this visit.There is no height or weight on file to calculate BMI.  General Appearance: NA  Eye  Contact:  NA  Speech:  Normal Rate  Volume:  Normal  Mood:  Describes some lingering depression but overall reports improved mood  Affect:  Appropriate  Thought Process:  Linear and Descriptions of Associations: Intact  Orientation:  Other:  Fully alert and attentive  Thought Content: Describes vague/subjective feelings of paranoia and distrust when in public or around people she does not know well.  At home does not have these symptoms.  Currently denies hallucinations.  No delusions are expressed.   Suicidal Thoughts:  No denies any suicidal or self-injurious ideations, denies homicidal or violent ideations  Homicidal Thoughts:  No  Memory:  Recent and remote grossly intact  Judgement:  Other:  Present  Insight:  Present  Psychomotor Activity:  NA  Concentration:  Concentration: Good and Attention Span: Good  Recall:  Good  Fund of Knowledge: Good  Language: Good  Akathisia:  Negative  Handed:  Right  AIMS (if indicated): Not done  Assets:  Desire for Improvement Resilience Social Support  ADL's:  NA  Cognition: WNL  Sleep:  Reports variable sleep   Screenings: AIMS     [facility]  AIMS Total Score  0  (Pended)     AUDIT     [facility]  Alcohol Use Disorder Identification Test Final Score (AUDIT)  0       Assessment and Plan:  58 year old married female with a history of schizoaffective disorder and a prior inpatient psychiatric admission in March 2020 for worsening depression and paranoia.  At this time describes improvement compared to before but does describe some lingering depression and vague feelings of distrust/paranoia in certain situations such as when she is outside of her home or around people she does not know well.  She states that at home she feels safe and husband is a strong support for her.  Currently she  denies medication side effects. Of note, patient is on Geodon, a follow-up EKG has been requested to monitor QTC but patient has not obtained it yet, expresses concern in going to clinic at this time based on COVID epidemic.   We have reviewed medication side effects. Based on residual depression and intermittent insomnia, as well as good tolerance/no side effects, we agreed to increase Remeron from 15 mg to 30 mg nightly.  Continue Geodon 40 mg twice daily for mood disorder/psychosis.  Continue Klonopin 0.5 mg every 12 hours on a as needed basis for anxiety. Next appointment in 4 to 6 weeks, patient agrees to contact clinic sooner should to be any worsening or concerns prior.  Craige CottaFernando A , MD 05/12/2019, 11:36 AM

## 2019-05-27 ENCOUNTER — Other Ambulatory Visit: Payer: Self-pay

## 2019-05-27 ENCOUNTER — Ambulatory Visit (INDEPENDENT_AMBULATORY_CARE_PROVIDER_SITE_OTHER): Payer: Medicare PPO | Admitting: Psychiatry

## 2019-05-27 DIAGNOSIS — F259 Schizoaffective disorder, unspecified: Secondary | ICD-10-CM | POA: Diagnosis not present

## 2019-05-27 DIAGNOSIS — F411 Generalized anxiety disorder: Secondary | ICD-10-CM

## 2019-05-27 NOTE — Progress Notes (Deleted)
No Show, tried to call 3 times.

## 2019-05-27 NOTE — Progress Notes (Signed)
Virtual Visit via Telephone Note  I connected with Andrea Bean on 05/27/19 at  4:00 PM EDT by telephone and verified that I am speaking with the correct person using two identifiers.  Location: Patient: Andrea Bean Provider: Lise Auer, LCSW   I discussed the limitations, risks, security and privacy concerns of performing an evaluation and management service by telephone and the availability of in person appointments. I also discussed with the patient that there may be a patient responsible charge related to this service. The patient expressed understanding and agreed to proceed.   History of Present Illness: Schizoaffective Disorder and GAD   Observations/Objective: Counselor met with Andrea Bean for individual therapy via telephone, after there were several failed attempts to connect. Counselor assessed MH symptoms and progress on treatment plan goals. Delaney denied suicidal ideation or self-harm behaviors. Andrea Bean shared that she has been actively paranoid for the past 2 weeks after being triggered by being out in public shopping. She currently believes someone is watching and following her, specifically a man. Her husband and daughters have repeatedly told her that she is safe and no one if following her. She reports not sleeping for the past 2 days. Counselor discussed medications, coping strategies, and potentially getting assessed at [facility] is hypervigilant, restless, on edge and upset with herself for having this episode. Counselor discussed contacting her pharmacy to be able to pick up the increased dose of medication ASAP, per her last appointment with Dr. Parke Poisson. Andrea Bean's husband said he would take her to be assessed if needed and that he would contact psychiatrist and pharmacy for medications. They will schedule follow up appointments with counselor and psychiatrist. Andrea Bean does not feel like hurting herself or others.   Assessment and Plan: Counselor will  continue to meet with patient to address treatment plan goals. Patient will continue to follow recommendations of providers and implement skills learned in session.  Follow Up Instructions: Counselor will send information for next session via Webex.     I discussed the assessment and treatment plan with the patient. The patient was provided an opportunity to ask questions and all were answered. The patient agreed with the plan and demonstrated an understanding of the instructions.   The patient was advised to call back or seek an in-person evaluation if the symptoms worsen or if the condition fails to improve as anticipated.  I provided 35 minutes of non-face-to-face time during this encounter.   Lise Auer, LCSW

## 2019-05-28 ENCOUNTER — Encounter (HOSPITAL_COMMUNITY): Payer: Self-pay | Admitting: Psychiatry

## 2019-06-23 ENCOUNTER — Encounter (HOSPITAL_COMMUNITY): Payer: Self-pay | Admitting: Psychiatry

## 2019-06-23 ENCOUNTER — Other Ambulatory Visit: Payer: Self-pay

## 2019-06-23 ENCOUNTER — Ambulatory Visit (INDEPENDENT_AMBULATORY_CARE_PROVIDER_SITE_OTHER): Payer: Medicare PPO | Admitting: Psychiatry

## 2019-06-23 VITALS — BP 142/80 | HR 78 | Temp 98.2°F | Ht 66.0 in | Wt 218.0 lb

## 2019-06-23 DIAGNOSIS — F251 Schizoaffective disorder, depressive type: Secondary | ICD-10-CM | POA: Diagnosis not present

## 2019-06-23 MED ORDER — ARIPIPRAZOLE 10 MG PO TABS: 10.0000 mg | ORAL_TABLET | Freq: Every day | ORAL | 1 refills | Status: DC

## 2019-06-23 MED ORDER — MIRTAZAPINE 30 MG PO TABS: 30.0000 mg | ORAL_TABLET | Freq: Every day | ORAL | 1 refills | Status: DC

## 2019-06-23 MED ORDER — CLONAZEPAM 0.5 MG PO TABS: 0.5000 mg | ORAL_TABLET | Freq: Two times a day (BID) | ORAL | 1 refills | Status: DC | PRN

## 2019-06-23 NOTE — Progress Notes (Signed)
BH MD/PA/NP OP Progress Note  06/23/2019 12:06 PM Andrea Bean  MRN:  [id number]  Chief Complaint: Patient presents for medication management appointment HPI: 58 year old female.  Lives with husband.  history of schizoaffective disorder diagnoses.  History of psychiatric admission in March 2020 for increased depression, overdose, in the context of medication noncompliance.  Also endorsed history of intermittent psychotic symptoms, mainly described as self-referential and paranoid ideations.  She reports she has been doing "okay".  She describes some mild residual depression but acknowledges overall improvement and currently does not endorse significant neurovegetative symptoms or any suicidal ideations.  She reports ongoing/frequent feelings of paranoia.  Does not endorse actual hallucinations.  Describes, for example that she has covered some of her homes windows for fear that somebody might be spying on her, often checks doors and windows at night, feels that people are monitoring her movements.  Of note she has some preserved reality testing and states "I realize I am paranoid".  She states that her husband is supportive and reassuring, and it is difficult for her to go places without them due to above paranoid ideations. Over recent months she has been managed with Geodon/Remeron/Klonopin PRN's for anxiety.  She denies side effects.  She denies any abuse or misuse of Klonopin.  Of note, an EKG was done in March-QTc 476.  As she is on Geodon I have requested repeat EKG to monitor QTC but patient states it has been challenging to get this test done due to COVID restrictions. We discussed options-as above, continues to endorse some paranoid ideations but I am concerned about increasing Geodon dose further without prior follow-up EKG. patient states that in the past Abilify was well-tolerated and seemed to "work for me". She agrees to Abilify trial.     Visit Diagnosis: Schizoaffective disorder by  history Past Psychiatric History: As above  Past Medical History:  Past Medical History:  Diagnosis Date  . Anxiety   . Bipolar disorder (HCC)   . Hypercholesteremia   . Hypertension   . Right foot drop 01/27/2013    Past Surgical History:  Procedure Laterality Date  . BREAST CYST EXCISION Right   . BREAST SURGERY Right    "duct removal"  . HIP SURGERY Right 1998   3 screws  . PARTIAL HYSTERECTOMY      Family Psychiatric History:   Family History:  Family History  Problem Relation Age of Onset  . Seizures Mother   . Hypertension Maternal Grandfather   . Diabetes Paternal Grandmother   . Hypertension Paternal Grandfather     Social History:  Social History   Socioeconomic History  . Marital status: Married    Spouse name: Not on file  . Number of children: Not on file  . Years of education: Not on file  . Highest education level: Not on file  Occupational History  . Occupation: disabled  Social Needs  . Financial resource strain: Not on file  . Food insecurity    Worry: Not on file    Inability: Not on file  . Transportation needs    Medical: Not on file    Non-medical: Not on file  Tobacco Use  . Smoking status: Never Smoker  . Smokeless tobacco: Never Used  Substance and Sexual Activity  . Alcohol use: No  . Drug use: No  . Sexual activity: Not on file  Lifestyle  . Physical activity    Days per week: Not on file    Minutes per session: Not  on file  . Stress: Not on file  Relationships  . Social Herbalist on phone: Not on file    Gets together: Not on file    Attends religious service: Not on file    Active member of club or organization: Not on file    Attends meetings of clubs or organizations: Not on file    Relationship status: Not on file  Other Topics Concern  . Not on file  Social History Narrative  . Not on file    Allergies:  Allergies  Allergen Reactions  . Atorvastatin     Metabolic Disorder Labs: Lab Results   Component Value Date   HGBA1C  11/20/2009    6.1 (NOTE) The ADA recommends the following therapeutic goal for glycemic control related to Hgb A1c measurement: Goal of therapy: <6.5 Hgb A1c  Reference: American Diabetes Association: Clinical Practice Recommendations 2010, Diabetes Care, 2010, 33: (Suppl  1).   MPG 128 11/20/2009   No results found for: PROLACTIN Lab Results  Component Value Date   CHOL (H) 11/20/2009    242        ATP III CLASSIFICATION:  <200     mg/dL   Desirable  200-239  mg/dL   Borderline High  >=240    mg/dL   High          TRIG 222 (H) 11/20/2009   HDL 46 11/20/2009   CHOLHDL 5.3 11/20/2009   VLDL 44 (H) 11/20/2009   LDLCALC (H) 11/20/2009    152        Total Cholesterol/HDL:CHD Risk Coronary Heart Disease Risk Table                     Men   Women  1/2 Average Risk   3.4   3.3  Average Risk       5.0   4.4  2 X Average Risk   9.6   7.1  3 X Average Risk  23.4   11.0        Use the calculated Patient Ratio above and the CHD Risk Table to determine the patient's CHD Risk.        ATP III CLASSIFICATION (LDL):  <100     mg/dL   Optimal  100-129  mg/dL   Near or Above                    Optimal  130-159  mg/dL   Borderline  160-189  mg/dL   High  >190     mg/dL   Very High     Therapeutic Level Labs: No results found for: LITHIUM No results found for: VALPROATE No components found for:  CBMZ  Current Medications: Current Outpatient Medications  Medication Sig Dispense Refill  . acetaminophen (TYLENOL) 325 MG tablet Take 2 tablets (650 mg total) by mouth every 6 (six) hours as needed for mild pain.    . ARIPiprazole (ABILIFY) 10 MG tablet Take 1 tablet (10 mg total) by mouth daily. 30 tablet 1  . atorvastatin (LIPITOR) 40 MG tablet Take 40 mg by mouth daily.    . clonazePAM (KLONOPIN) 0.5 MG tablet Take 1 tablet (0.5 mg total) by mouth every 12 (twelve) hours as needed (anxiety). 30 tablet 1  . linaclotide (LINZESS) 290 MCG CAPS capsule  Take 290 mcg by mouth daily before breakfast.    . lisinopril (PRINIVIL,ZESTRIL) 10 MG tablet Take 1 tablet (10 mg total) by mouth  daily. For high blood pressure 30 tablet 0  . metoprolol tartrate (LOPRESSOR) 25 MG tablet Take 1 tablet (25 mg total) by mouth 2 (two) times daily. For high blood pressure 60 tablet 0  . mirtazapine (REMERON) 30 MG tablet Take 1 tablet (30 mg total) by mouth at bedtime. 30 tablet 1  . omeprazole (PRILOSEC) 20 MG capsule Take 20 mg by mouth daily.     No current facility-administered medications for this visit.      Musculoskeletal: Strength & Muscle Tone: within normal limits Gait & Station: normal Patient leans: N/A  Psychiatric Specialty Exam: ROS denies headache or chest pain, no shortness of breath, no nausea, no vomiting  Blood pressure (!) 142/80, pulse 78, temperature 98.2 F (36.8 C), height 5\' 6"  (1.676 m), weight 218 lb (98.9 kg).Body mass index is 35.19 kg/m.  General Appearance: Fairly Groomed  Eye Contact:  Good  Speech:  Normal Rate  Volume:  Normal  Mood:  Describes some lingering depression, although endorses generally improved mood  Affect:  Appropriate, vaguely cautious/anxious  Thought Process:  Linear and Descriptions of Associations: Intact  Orientation:  Other:  Fully alert and attentive  Thought Content: Denies hallucinations,(+) self-referential/vague paranoid ideations.  No overt delusions expressed   Suicidal Thoughts:  No currently denies suicidal or self-injurious ideations  Homicidal Thoughts:  No  Memory:  Recent and remote grossly intact  Judgement:  Other:  Present  Insight:  Fair/present  Psychomotor Activity:  Normal-no psychomotor agitation, no indication of akathisia  Concentration:  Concentration: Good and Attention Span: Good  Recall:  Good  Fund of Knowledge: Good  Language: Good  Akathisia:  Negative  Handed:  Right  AIMS (if indicated): No abnormal movements noted or endorsed at this time  Assets:   Resilience Social Support  ADL's:  Intact  Cognition: WNL  Sleep:  Good   Screenings: AIMS     [facility]  AIMS Total Score  0  (Pended)     AUDIT     [facility]  Alcohol Use Disorder Identification Test Final Score (AUDIT)  0       Assessment and Plan:  58 year old female.  History of schizoaffective disorder.  History of a psychiatric admission for depression and suicidal ideations earlier this year.  Currently reports partial improvement although describes some lingering depression (without any suicidal ideations) and some persistent self-referential/paranoid ideations.  Currently does not present with hallucinations or thought disorder.  Overall she has tolerated Geodon well but is mentioned she does have residual symptoms.  She has had difficulty obtaining follow-up EKG (to monitor QTc interval as on Geodon) and QTC in March/2020 was 476.  Due to the above, we discussed rationale to switch to another antipsychotic with less likelihood to cause QTC prolongation.  She states Abilify has been well-tolerated and was perceived as helpful during the past trial.  Plan then is to discontinue Geodon.  Start Abilify 10 mg daily, continue Remeron 30 mg nightly, continue Klonopin 0.5 mg twice daily PRN for anxiety.  Side effects reviewed. We will see in 1 month, patient agrees to contact clinic sooner should to be any worsening or concern prior.   April/2020, MD 06/23/2019, 12:06 PM

## 2019-06-25 ENCOUNTER — Ambulatory Visit (INDEPENDENT_AMBULATORY_CARE_PROVIDER_SITE_OTHER): Payer: Medicare PPO | Admitting: Psychiatry

## 2019-06-25 ENCOUNTER — Other Ambulatory Visit: Payer: Self-pay

## 2019-06-25 DIAGNOSIS — F251 Schizoaffective disorder, depressive type: Secondary | ICD-10-CM

## 2019-06-26 ENCOUNTER — Encounter (HOSPITAL_COMMUNITY): Payer: Self-pay | Admitting: Psychiatry

## 2019-06-26 NOTE — Progress Notes (Signed)
Virtual Visit via Video Note  I connected with Marzetta Board on 06/26/19 at  3:00 PM EDT by a video enabled telemedicine application and verified that I am speaking with the correct person using two identifiers.  Location: Patient: Andrea Bean Provider: Lise Auer, LCSW   I discussed the limitations of evaluation and management by telemedicine and the availability of in person appointments. The patient expressed understanding and agreed to proceed.  History of Present Illness: Schizoaffective DO   Observations/Objective: Counselor met with Canna for individual therapy via Webex. Counselor assessed MH symptoms and progress on treatment plan goals. Libbi denied suicidal ideation or self-harm behaviors. Yanice shared that she has been experiencing a manic episode over the past 2 weeks, and endorsed paranoia. Donta stated that she has been unable to sleep for 2 nights and is experiencing rapid speech as well as being irrationally irritable towards her husband and daughters. Counselor explored coping strategies being utilized. Arlyce requested her husband share his observations during the session. Counselor reviewed crisis plan to ensure chane magner receives enhanced services if symptoms worsen. Counselor reassured Laycie of professionals roles to look to for her well-being, not to trigger or worsen her state, as she shared that she was suspicious of both her psychiatrist and the counselor. Over the course of the session Journi's speech slowed to a normal pace, she became less anxious, and stated she felt more comfortable with the Counselor's support. Counselor and Shaelin identified additional coping strategies for an upcoming events she would like to attend without her paranoia interfering.   Assessment and Plan: Counselor will continue to meet with patient to address treatment plan goals. Patient will continue to follow recommendations of providers and implement skills learned in  session.  Follow Up Instructions: Counselor will send information for next session via Webex.     I discussed the assessment and treatment plan with the patient. The patient was provided an opportunity to ask questions and all were answered. The patient agreed with the plan and demonstrated an understanding of the instructions.   The patient was advised to call back or seek an in-person evaluation if the symptoms worsen or if the condition fails to improve as anticipated.  I provided 60 minutes of non-face-to-face time during this encounter.   Lise Auer, LCSW

## 2019-07-21 ENCOUNTER — Other Ambulatory Visit: Payer: Self-pay

## 2019-07-21 ENCOUNTER — Ambulatory Visit (INDEPENDENT_AMBULATORY_CARE_PROVIDER_SITE_OTHER): Payer: Medicare PPO | Admitting: Psychiatry

## 2019-07-21 ENCOUNTER — Encounter (HOSPITAL_COMMUNITY): Payer: Self-pay | Admitting: Psychiatry

## 2019-07-21 DIAGNOSIS — F259 Schizoaffective disorder, unspecified: Secondary | ICD-10-CM

## 2019-07-21 MED ORDER — MIRTAZAPINE 30 MG PO TABS: 30.0000 mg | ORAL_TABLET | Freq: Every day | ORAL | 1 refills | Status: AC

## 2019-07-21 MED ORDER — CLONAZEPAM 0.5 MG PO TABS: 0.5000 mg | ORAL_TABLET | Freq: Two times a day (BID) | ORAL | 1 refills | Status: AC | PRN

## 2019-07-21 MED ORDER — ARIPIPRAZOLE 15 MG PO TABS: 15.0000 mg | ORAL_TABLET | Freq: Every day | ORAL | 1 refills | Status: AC

## 2019-07-21 NOTE — Progress Notes (Addendum)
BH MD/PA/NP OP Progress Note  07/21/2019 2:46 PM Andrea PlanKaren D Bean  MRN:  [id number]  Chief Complaint: Medication management appointment HPI: This appointment was conducted via phone due to Covid related precautions.  Patient's identity confirmed via 2 separate identifiers.  Limitations associated with this mode of communication have been reviewed. Patient reports that she has been experiencing some persistent symptoms.  These include a mild yet persistent sense of depression.  She acknowledges occasional passive thoughts of death but denies any actual self-injurious ideations or suicidal ideations and states that her love for her children/husband is a protective factor. She does acknowledge some persistent paranoia.  This is described mostly as a vague but persistent sense that she is being spied on a monitored.  It has led her to cover windows at her home and to feel anxious when she is outside the home.  She does not endorse actual hallucinations.  States that her husband's reassurance that she has not been monitored or in any danger help "a little" but that she continues to have the ideations anyway. She feels that Geodon may have been more effective than Abilify at current dose and managing the above symptoms.  Currently denies medication side effects and states that they are well-tolerated.  Visit Diagnosis: Schizoaffective disorder by history. Past Psychiatric History:  Past Medical History:  Past Medical History:  Diagnosis Date  . Anxiety   . Bipolar disorder (HCC)   . Hypercholesteremia   . Hypertension   . Right foot drop 01/27/2013    Past Surgical History:  Procedure Laterality Date  . BREAST CYST EXCISION Right   . BREAST SURGERY Right    "duct removal"  . HIP SURGERY Right 1998   3 screws  . PARTIAL HYSTERECTOMY      Family Psychiatric History:   Family History:  Family History  Problem Relation Age of Onset  . Seizures Mother   . Hypertension Maternal Grandfather    . Diabetes Paternal Grandmother   . Hypertension Paternal Grandfather     Social History:  Social History   Socioeconomic History  . Marital status: Married    Spouse name: Not on file  . Number of children: Not on file  . Years of education: Not on file  . Highest education level: Not on file  Occupational History  . Occupation: disabled  Social Needs  . Financial resource strain: Not on file  . Food insecurity    Worry: Not on file    Inability: Not on file  . Transportation needs    Medical: Not on file    Non-medical: Not on file  Tobacco Use  . Smoking status: Never Smoker  . Smokeless tobacco: Never Used  Substance and Sexual Activity  . Alcohol use: No  . Drug use: No  . Sexual activity: Not on file  Lifestyle  . Physical activity    Days per week: Not on file    Minutes per session: Not on file  . Stress: Not on file  Relationships  . Social Musicianconnections    Talks on phone: Not on file    Gets together: Not on file    Attends religious service: Not on file    Active member of club or organization: Not on file    Attends meetings of clubs or organizations: Not on file    Relationship status: Not on file  Other Topics Concern  . Not on file  Social History Narrative  . Not on file  Allergies:  Allergies  Allergen Reactions  . Atorvastatin     Metabolic Disorder Labs: Lab Results  Component Value Date   HGBA1C  11/20/2009    6.1 (NOTE) The ADA recommends the following therapeutic goal for glycemic control related to Hgb A1c measurement: Goal of therapy: <6.5 Hgb A1c  Reference: American Diabetes Association: Clinical Practice Recommendations 2010, Diabetes Care, 2010, 33: (Suppl  1).   MPG 128 11/20/2009   No results found for: PROLACTIN Lab Results  Component Value Date   CHOL (H) 11/20/2009    242        ATP III CLASSIFICATION:  <200     mg/dL   Desirable  200-239  mg/dL   Borderline High  >=240    mg/dL   High          TRIG 222 (H)  11/20/2009   HDL 46 11/20/2009   CHOLHDL 5.3 11/20/2009   VLDL 44 (H) 11/20/2009   LDLCALC (H) 11/20/2009    152        Total Cholesterol/HDL:CHD Risk Coronary Heart Disease Risk Table                     Men   Women  1/2 Average Risk   3.4   3.3  Average Risk       5.0   4.4  2 X Average Risk   9.6   7.1  3 X Average Risk  23.4   11.0        Use the calculated Patient Ratio above and the CHD Risk Table to determine the patient's CHD Risk.        ATP III CLASSIFICATION (LDL):  <100     mg/dL   Optimal  100-129  mg/dL   Near or Above                    Optimal  130-159  mg/dL   Borderline  160-189  mg/dL   High  >190     mg/dL   Very High     Therapeutic Level Labs: No results found for: LITHIUM No results found for: VALPROATE No components found for:  CBMZ  Current Medications: Current Outpatient Medications  Medication Sig Dispense Refill  . acetaminophen (TYLENOL) 325 MG tablet Take 2 tablets (650 mg total) by mouth every 6 (six) hours as needed for mild pain.    . ARIPiprazole (ABILIFY) 10 MG tablet Take 1 tablet (10 mg total) by mouth daily. 30 tablet 1  . atorvastatin (LIPITOR) 40 MG tablet Take 40 mg by mouth daily.    . clonazePAM (KLONOPIN) 0.5 MG tablet Take 1 tablet (0.5 mg total) by mouth every 12 (twelve) hours as needed (anxiety). 30 tablet 1  . linaclotide (LINZESS) 290 MCG CAPS capsule Take 290 mcg by mouth daily before breakfast.    . lisinopril (PRINIVIL,ZESTRIL) 10 MG tablet Take 1 tablet (10 mg total) by mouth daily. For high blood pressure 30 tablet 0  . metoprolol tartrate (LOPRESSOR) 25 MG tablet Take 1 tablet (25 mg total) by mouth 2 (two) times daily. For high blood pressure 60 tablet 0  . mirtazapine (REMERON) 30 MG tablet Take 1 tablet (30 mg total) by mouth at bedtime. 30 tablet 1  . omeprazole (PRILOSEC) 20 MG capsule Take 20 mg by mouth daily.     No current facility-administered medications for this visit.        Psychiatric Specialty  Exam: Please take into account  limitations in obtaining a full mental status exam in the context of this mode of communication ROS does not endorse akathisia or abnormal movements  There were no vitals taken for this visit.There is no height or weight on file to calculate BMI.  General Appearance: NA  Eye Contact:  NA  Speech:  Normal Rate  Volume:  Normal  Mood:  Reports some persistent depression  Affect:  NA  Thought Process:  Linear and Descriptions of Associations: Intact  Orientation:  Full (Time, Place, and Person)  Thought Content: As above, reports persistent vague paranoid ideations without  hallucinations.   Suicidal Thoughts:  No reports intermittent passive thoughts of death but denies any suicidal Bean or intention, contracts for safety, identifies love for family members as a protective factor  Homicidal Thoughts:  No  Memory:  Recent and remote grossly intact  Judgement:  Other:  Present  Insight:  Present  Psychomotor Activity:  NA  Concentration:  Concentration: Good and Attention Span: Good  Recall:  Good  Fund of Knowledge: Good  Language: Good  Akathisia:  Negative  Handed:  Right  AIMS (if indicated): AIMS test not done   Assets:  Communication Skills Desire for Improvement Resilience  ADL's:  Intact  Cognition: WNL  Sleep:  Reports sleep is variable   Screenings: AIMS     [facility]  AIMS Total Score  0  (Pended)     AUDIT     Ad[facility]  Alcohol Use Disorder Identification Test Final Score (AUDIT)  0       Assessment and Bean:  58 year old female, lives with husband, history of schizoaffective disorder.  Patient reports lingering symptoms including a low-grade but persistent sense of depression and ongoing self-referential/paranoid ideations, mainly relating to a feeling that she is being watched or  monitored from outside her home.  Does not endorse actual hallucinations and no thought disorder is noted. We had recently switched her from Geodon to Abilify.  This decision was made in part because patient was having difficulty in obtaining a follow-up/routine EKG to monitor QTC and a March 2020 QTC was borderline elevated at 476.  She is now on Abilify at 10 mg daily.  She does feel that Geodon may have been more effective than the Abilify at the current dose.  She is tolerating her current medication regimen (Abilify, Remeron, low-dose Klonopin on a as needed basis) well without side effects.  We discussed options.  Agrees to continue Abilify trial for now-we will increase dose to 15 mg daily.  Continue Remeron at 30 mg nightly and Klonopin at 0.5 mg twice daily as needed for anxiety ( does not take every day).  We will see in 4 to 6 weeks.  Patient agrees to contact clinic sooner should to be any worsening or concern prior.   Craige Cotta, MD 07/21/2019, 2:46 PM

## 2019-09-30 ENCOUNTER — Other Ambulatory Visit: Payer: Self-pay

## 2019-09-30 DIAGNOSIS — Z20822 Contact with and (suspected) exposure to covid-19: Secondary | ICD-10-CM

## 2019-10-01 LAB — NOVEL CORONAVIRUS, NAA: SARS-CoV-2, NAA: NOT DETECTED

## 2022-03-04 LAB — COLOGUARD: COLOGUARD: POSITIVE — AB

## 2022-04-10 ENCOUNTER — Other Ambulatory Visit: Payer: Self-pay | Admitting: Family Medicine

## 2022-04-10 DIAGNOSIS — N6310 Unspecified lump in the right breast, unspecified quadrant: Secondary | ICD-10-CM

## 2023-10-09 ENCOUNTER — Other Ambulatory Visit (HOSPITAL_COMMUNITY): Payer: Self-pay

## 2024-06-25 ENCOUNTER — Telehealth: Payer: Self-pay

## 2024-06-25 NOTE — Telephone Encounter (Signed)
 Called and LVM asking pt to bring CPAP or BiPAP with her to her appt on 06/26/24 if she is using one.

## 2024-06-26 ENCOUNTER — Ambulatory Visit

## 2024-06-26 VITALS — BP 138/72 | HR 81 | Ht 66.0 in | Wt 239.0 lb

## 2024-06-26 DIAGNOSIS — G4733 Obstructive sleep apnea (adult) (pediatric): Secondary | ICD-10-CM

## 2024-06-26 DIAGNOSIS — J4540 Moderate persistent asthma, uncomplicated: Secondary | ICD-10-CM

## 2024-06-26 DIAGNOSIS — E66812 Obesity, class 2: Secondary | ICD-10-CM

## 2024-06-26 DIAGNOSIS — Z6838 Body mass index (BMI) 38.0-38.9, adult: Secondary | ICD-10-CM

## 2024-06-26 DIAGNOSIS — G2581 Restless legs syndrome: Secondary | ICD-10-CM

## 2024-06-26 LAB — CBC WITH DIFFERENTIAL/PLATELET
Basophils Absolute: 0.1 K/uL (ref 0.0–0.1)
Basophils Relative: 0.9 % (ref 0.0–3.0)
Eosinophils Absolute: 0.2 K/uL (ref 0.0–0.7)
Eosinophils Relative: 1.8 % (ref 0.0–5.0)
HCT: 42.4 % (ref 36.0–46.0)
Hemoglobin: 14.2 g/dL (ref 12.0–15.0)
Lymphocytes Relative: 42.8 % (ref 12.0–46.0)
Lymphs Abs: 3.7 K/uL (ref 0.7–4.0)
MCHC: 33.5 g/dL (ref 30.0–36.0)
MCV: 87.2 fl (ref 78.0–100.0)
Monocytes Absolute: 0.6 K/uL (ref 0.1–1.0)
Monocytes Relative: 6.9 % (ref 3.0–12.0)
Neutro Abs: 4.1 K/uL (ref 1.4–7.7)
Neutrophils Relative %: 47.6 % (ref 43.0–77.0)
Platelets: 393 K/uL (ref 150.0–400.0)
RBC: 4.86 Mil/uL (ref 3.87–5.11)
RDW: 15.5 % (ref 11.5–15.5)
WBC: 8.6 K/uL (ref 4.0–10.5)

## 2024-06-26 LAB — IRON,TIBC AND FERRITIN PANEL
%SAT: 25 % (ref 16–45)
Ferritin: 176 ng/mL (ref 16–288)
Iron: 84 ug/dL (ref 45–160)
TIBC: 330 ug/dL (ref 250–450)

## 2024-06-26 NOTE — Patient Instructions (Signed)
 Notification of test results are managed in the following manner: If there are any recommendations or changes to the plan of care discussed in office today, we will contact you and let you know what they are. If you do not hear from us , then your results are normal/expected and you can view them through your MyChart account, or a letter will be sent to you. Thank you again for trusting us  with your care [facility].

## 2024-06-26 NOTE — Assessment & Plan Note (Addendum)
 Mild OSA per 3% but no OSA per 4%.  She is on Medicare.  Will get in-lab sleep study to evaluate if she has mild OSA per 4% criteria because her symptoms are classic for OSA. I discussed with the patient the pathophysiology of obstructive sleep apnea, its association with weight, and its negative effects on hypertension, diabetes, mental health, A-fib, stroke if left untreated.  I briefly discussed the treatment options for obstructive sleep apnea  Orders:   Nocturnal polysomnography; Future

## 2024-06-26 NOTE — Progress Notes (Addendum)
 New Patient Pulmonology Office Visit   Subjective:  Patient ID: Andrea Bean, female    DOB: 1961/07/23  MRN: [id number]  Referred by: Jeannetta Fairy DELENA DEVONNA  CC:  Chief Complaint  Patient presents with   Consult    Pt states she had a HST 3-6 months ago. No CPAP or BiPAP.  [facility] in [place] had ordered the HST.  Snores, takes over 1hr to fall asleep, wakes up 3-4 times per night, pt states she is sort of tired throughout the day.    HPI Andrea Bean is a 63 y.o. female with asthma, Hypertension, HLD, schizoaffective disorder presents for evaluation of OSA.  OSA history: Mild OSA per 3% in March 2025.   Symptoms: has asthma. Does not know the name of inhalers. Takes everyday. Usually will walk 1 mile and will be wheezing. Intermittent cough worse at night everyday. No phlegm. Does wheeze. Has not needed steroids for a while. Has seasonal allergies. No prior PFTs.  No prior PFTs.   Mouth breather: y Preferred sleeping position: side  Sleep related Symptoms:  Snoring- y Witnessed apnea- y Gasping/choking- y morning HA/dry mouth- y/y tired on awakening, excessive daytime sleepiness- yes throughout the day.  Restless legs- moving her legs in sleep. Also have constant urge to move legs when in bed.  No parasomnia.   Sleep routine:  -Bed: MN. Takes 1 hr to sleep.  -Nocturnal awakenings: 3-4 times to urinate.  -Wake: 7-8a -Napping:every day 1-2 hours.  -sleep hygiene: usually watching TV and reading.   Social Hist/Habits:  -Caffeine: no -Alcohol: no -Nicotine:no -Occupation: was a Conservation officer, nature at [employer].  PRIOR TESTS and IMAGING: PSG/HSAT: HST March 2025: No desaturation, O2 nadir 85% 3% AHI 11, 4% AHI 1.2.     06/26/2024    9:00 AM  Results of the Epworth flowsheet  Sitting and reading 2  Watching TV 2  Sitting, inactive in a public place (e.g. a theatre or a meeting) 1  As a passenger in a car for an hour without a break 2  Lying down to rest in the  afternoon when circumstances permit 3  Sitting and talking to someone 2  Sitting quietly after a lunch without alcohol 2  In a car, while stopped for a few minutes in traffic 1  Total score 15    Allergies: Atorvastatin   Current Outpatient Medications:    acetaminophen  (TYLENOL ) 325 MG tablet, Take 2 tablets (650 mg total) by mouth every 6 (six) hours as needed for mild pain., Disp: , Rfl:    ARIPiprazole  (ABILIFY ) 15 MG tablet, Take 1 tablet (15 mg total) by mouth daily., Disp: 30 tablet, Rfl: 1   atorvastatin  (LIPITOR) 40 MG tablet, Take 40 mg by mouth daily., Disp: , Rfl:    clonazePAM  (KLONOPIN ) 0.5 MG tablet, Take 1 tablet (0.5 mg total) by mouth every 12 (twelve) hours as needed (anxiety)., Disp: 20 tablet, Rfl: 1   linaclotide (LINZESS) 290 MCG CAPS capsule, Take 290 mcg by mouth daily before breakfast., Disp: , Rfl:    lisinopril  (PRINIVIL ,ZESTRIL ) 10 MG tablet, Take 1 tablet (10 mg total) by mouth daily. For high blood pressure, Disp: 30 tablet, Rfl: 0   metoprolol  tartrate (LOPRESSOR ) 25 MG tablet, Take 1 tablet (25 mg total) by mouth 2 (two) times daily. For high blood pressure, Disp: 60 tablet, Rfl: 0   mirtazapine  (REMERON ) 30 MG tablet, Take 1 tablet (30 mg total) by mouth at bedtime., Disp: 30 tablet, Rfl: 1  omeprazole (PRILOSEC) 20 MG capsule, Take 20 mg by mouth daily., Disp: , Rfl:  Past Medical History:  Diagnosis Date   Anxiety    Bipolar disorder (HCC)    Hypercholesteremia    Hypertension    Right foot drop 01/27/2013   Past Surgical History:  Procedure Laterality Date   BREAST CYST EXCISION Right    BREAST SURGERY Right    duct removal   HIP SURGERY Right 1998   3 screws   PARTIAL HYSTERECTOMY     Family History  Problem Relation Age of Onset   Seizures Mother    Hypertension Maternal Grandfather    Diabetes Paternal Grandmother    Hypertension Paternal Grandfather    Social History   Socioeconomic History   Marital status: Married    Spouse  name: Not on file   Number of children: Not on file   Years of education: Not on file   Highest education level: Not on file  Occupational History   Occupation: disabled  Tobacco Use   Smoking status: Never   Smokeless tobacco: Never  Vaping Use   Vaping status: Never Used  Substance and Sexual Activity   Alcohol use: No   Drug use: No   Sexual activity: Not on file  Other Topics Concern   Not on file  Social History Narrative   Not on file   Social Drivers of Health   Financial Resource Strain: Not on file  Food Insecurity: Not on file  Transportation Needs: Not on file  Physical Activity: Not on file  Stress: Not on file  Social Connections: Unknown (02/03/2022)   [facility]   Social Network    Social Network: Not on file  Intimate Partner Violence: Unknown (01/02/2022)   Received from [facility]   HITS    Physically Hurt: Not on file    Insult or Talk Down To: Not on file    Threaten Physical Harm: Not on file    Scream or Curse: Not on file       Objective:  BP 138/72   Pulse 81   Ht 5' 6 (1.676 m)   Wt 239 lb (108.4 kg)   SpO2 97% Comment: RA  BMI 38.58 kg/m  BMI Readings from Last 3 Encounters:  06/26/24 38.58 kg/m  12/06/18 32.54 kg/m  06/22/13 32.55 kg/m    Physical Exam: CONSTITUTIONAL: NAD, well-appearing NASAL/OROPHARYNX:  Normal mucosa. No septal deviation. No hypertrophy of inferior turbinates. Modified Mallampati score 4.  CV: RRR s1s2 nl, no murmurs  RESP: Clear to auscultation, normal respiratory effort   NEURO: CN II/XII grossly intact PSYCH: Alert & oriented x 3, Euthymic, appropriate affect  Diagnostic Review:  Last CBC Lab Results  Component Value Date   WBC 7.4 12/07/2018   HGB 10.2 (L) 12/07/2018   HCT 33.6 (L) 12/07/2018   MCV 95.5 12/07/2018   MCH 29.0 12/07/2018   RDW 14.2 12/07/2018   PLT 335 12/07/2018   Last metabolic panel Lab Results  Component Value Date   GLUCOSE 111 (H) 12/10/2018   NA  137 12/10/2018   K 3.5 12/10/2018   CL 102 12/10/2018   CO2 21 (L) 12/10/2018   BUN 17 12/10/2018   CREATININE 1.41 (H) 12/10/2018   GFRNONAA 40 (L) 12/10/2018   CALCIUM  10.0 12/10/2018   PROT 8.2 (H) 12/06/2018   ALBUMIN 4.3 12/06/2018   LABGLOB 3.3 01/27/2013   AGRATIO 1.4 01/27/2013   BILITOT 0.7 12/06/2018   ALKPHOS 90 12/06/2018  AST 21 12/06/2018   ALT 20 12/06/2018   ANIONGAP 14 12/10/2018         Assessment & Plan:   Assessment & Plan OSA (obstructive sleep apnea) Mild OSA per 3% but no OSA per 4%.  She is on Medicare.  Will get in-lab sleep study to evaluate if she has mild OSA per 4% criteria because her symptoms are classic for OSA. I discussed with the patient the pathophysiology of obstructive sleep apnea, its association with weight, and its negative effects on hypertension, diabetes, mental health, A-fib, stroke if left untreated.  I briefly discussed the treatment options for obstructive sleep apnea  Orders:   Nocturnal polysomnography; Future  Restless leg syndrome Will evaluate PLMS via PSG. Remeron  can worsen RLS.  Will consider iron transfusions based on iron studies. Orders:   Iron, TIBC and Ferritin Panel; Future  Moderate persistent asthma without complication She is unsure of the name of inhalers.  However currently not controlled with daily symptoms Patient to call office with names of inhalers. Orders:   Pulmonary Function Test; Future   CBC with Differential/Platelet; Future  Obesity, class 2 Currently on lifestyle modification.  Discussed with her getting referral for weight loss clinic through primary care. Discussed if she is positive for OSA per 4% criteria we might consider putting her on Zepbound for weight loss.     She was counselled about not driving while drowsy which is common side effect of sleep related disorders.   Return in about 3 months (around 09/25/2024).   Harlen Danford, MD

## 2024-06-30 ENCOUNTER — Ambulatory Visit: Payer: Self-pay

## 2024-08-25 ENCOUNTER — Ambulatory Visit (HOSPITAL_BASED_OUTPATIENT_CLINIC_OR_DEPARTMENT_OTHER)

## 2024-08-25 DIAGNOSIS — G4733 Obstructive sleep apnea (adult) (pediatric): Secondary | ICD-10-CM

## 2024-09-01 ENCOUNTER — Ambulatory Visit

## 2024-09-01 VITALS — BP 144/78 | HR 93 | Ht 66.0 in | Wt 243.0 lb

## 2024-09-01 DIAGNOSIS — G2581 Restless legs syndrome: Secondary | ICD-10-CM

## 2024-09-01 DIAGNOSIS — G4733 Obstructive sleep apnea (adult) (pediatric): Secondary | ICD-10-CM

## 2024-09-01 DIAGNOSIS — J4540 Moderate persistent asthma, uncomplicated: Secondary | ICD-10-CM

## 2024-09-01 DIAGNOSIS — E66812 Obesity, class 2: Secondary | ICD-10-CM

## 2024-09-01 MED ORDER — MOMETASONE FURO-FORMOTEROL FUM 100-5 MCG/ACT IN AERO: 2.0000 | INHALATION_SPRAY | Freq: Two times a day (BID) | RESPIRATORY_TRACT | 4 refills | Status: AC

## 2024-09-01 NOTE — Progress Notes (Signed)
 Patient unable to perform pft test today. Made provider aware.

## 2024-09-01 NOTE — Progress Notes (Addendum)
 Pulmonology Office Visit   Subjective:  Patient ID: Andrea Bean, female    DOB: [birth year]  MRN: [id number]  Referred by: Theodoro Lakes, MD  CC:  Chief Complaint  Patient presents with   Follow-up    Pt stated that she wasn't able to complete her  PFT, pt stated that she doing okay  has no new concerns     HPI Andrea Bean is a 63 y.o. female non smoker with asthma, Hypertension, HLD, schizoaffective disorder presents for evaluation of OSA.   06/26/24>uncontrolled asthma>asked to get inhalers to next appnt, PFT. Symptoms of OSA>in lab PSG. Iron studies for RLS.   Respective notes from provider reviewed as appropriate to gather relevant information for patient care.   Discussed the use of AI scribe software for clinical note transcription with the patient, who gave verbal consent to proceed.  History of Present Illness   Andrea Bean is a 63 year old female with asthma who presents for follow-up after pulmonary function tests and a sleep study.  She has a history of asthma since childhood with worsening symptoms, including nighttime coughing, wheezing, and shortness of breath, which limit her ability to exercise. She uses Flovent twice daily and Albuterol as needed, with no recent use of prednisone. Her symptoms may be exacerbated by weight gain and exposure to strong smells, fumes, or chemicals. Her asthma symptoms worsened during pregnancy.  A recent sleep study revealed mild obstructive sleep apnea. She experiences restless legs two to three times a week, which improve with movement. Her iron levels are borderline, and she has not been on iron infusions before.  She takes omeprazole for acid reflux and uses Benadryl for severe allergies. No alcohol or coffee consumption. Family history includes asthma in her daughter and granddaughter, and COPD in her grandmother and aunt.     ASTHMA:  First diagnosed: as a kid.  FH: daughter. Granddaughter.  Triggers: worsening with  weight gain.  Intubated: no Last steroid use: none recently.  Times albuterol used: 2 times a day.  Treatment: asmanex and albuterol.  Others: pregnancy- yes, allergy symptoms- yes, GERD- prilosec. , OSA- mild oSA.   OSA history: Mild OSA per 3% in March 2025.    Sleep routine:  -Bed: MN. Takes 1 hr to sleep.  -Nocturnal awakenings: 3-4 times to urinate.  -Wake: 7-8a -Napping:every day 1-2 hours.  -sleep hygiene: usually watching TV and reading.   PRIOR TESTS and IMAGING: PSG/HSAT:HST March 2025: No desaturation, O2 nadir 85% 3% AHI 11, 4% AHI 1.2.  NPSG11/25/25> weight 230lb, Ahi3%15, o2 nadir 79%, desaturation 2.42min, no significant PLMS  PFT Dec 2205>could not be performed.   06/26/24> ferritin 176, %sat 25.      06/26/2024    9:00 AM  Results of the Epworth flowsheet  Sitting and reading 2  Watching TV 2  Sitting, inactive in a public place (e.g. a theatre or a meeting) 1  As a passenger in a car for an hour without a break 2  Lying down to rest in the afternoon when circumstances permit 3  Sitting and talking to someone 2  Sitting quietly after a lunch without alcohol 2  In a car, while stopped for a few minutes in traffic 1  Total score 15    Allergies: Atorvastatin   Current Outpatient Medications:    acetaminophen  (TYLENOL ) 325 MG tablet, Take 2 tablets (650 mg total) by mouth every 6 (six) hours as needed for mild pain., Disp: , Rfl:  ARIPiprazole  (ABILIFY ) 15 MG tablet, Take 1 tablet (15 mg total) by mouth daily., Disp: 30 tablet, Rfl: 1   atorvastatin  (LIPITOR) 40 MG tablet, Take 40 mg by mouth daily., Disp: , Rfl:    clonazePAM  (KLONOPIN ) 0.5 MG tablet, Take 1 tablet (0.5 mg total) by mouth every 12 (twelve) hours as needed (anxiety)., Disp: 20 tablet, Rfl: 1   linaclotide (LINZESS) 290 MCG CAPS capsule, Take 290 mcg by mouth daily before breakfast., Disp: , Rfl:    lisinopril  (PRINIVIL ,ZESTRIL ) 10 MG tablet, Take 1 tablet (10 mg total) by mouth daily. For  high blood pressure, Disp: 30 tablet, Rfl: 0   metoprolol  tartrate (LOPRESSOR ) 25 MG tablet, Take 1 tablet (25 mg total) by mouth 2 (two) times daily. For high blood pressure, Disp: 60 tablet, Rfl: 0   mirtazapine  (REMERON ) 30 MG tablet, Take 1 tablet (30 mg total) by mouth at bedtime., Disp: 30 tablet, Rfl: 1   mometasone-formoterol (DULERA) 100-5 MCG/ACT AERO, Inhale 2 puffs into the lungs 2 (two) times daily., Disp: 1 each, Rfl: 4   omeprazole (PRILOSEC) 20 MG capsule, Take 20 mg by mouth daily., Disp: , Rfl:  Past Medical History:  Diagnosis Date   Anxiety    Bipolar disorder (HCC)    Hypercholesteremia    Hypertension    Right foot drop 01/27/2013   Past Surgical History:  Procedure Laterality Date   BREAST CYST EXCISION Right    BREAST SURGERY Right    duct removal   HIP SURGERY Right 1998   3 screws   PARTIAL HYSTERECTOMY     Family History  Problem Relation Age of Onset   Seizures Mother    Hypertension Maternal Grandfather    Diabetes Paternal Grandmother    Hypertension Paternal Grandfather    Social History   Socioeconomic History   Marital status: Married    Spouse name: Not on file   Number of children: Not on file   Years of education: Not on file   Highest education level: Not on file  Occupational History   Occupation: disabled  Tobacco Use   Smoking status: Never   Smokeless tobacco: Never  Vaping Use   Vaping status: Never Used  Substance and Sexual Activity   Alcohol use: No   Drug use: No   Sexual activity: Not on file  Other Topics Concern   Not on file  Social History Narrative   Not on file   Social Drivers of Health   Financial Resource Strain: Not on file  Food Insecurity: Not on file  Transportation Needs: Not on file  Physical Activity: Not on file  Stress: Not on file  Social Connections: Not on file  Intimate Partner Violence: Not on file       Objective:  BP (!) 144/78   Pulse 93   Ht 5' 6 (1.676 m)   Wt 243 lb  (110.2 kg)   SpO2 96%   BMI 39.22 kg/m  BMI Readings from Last 3 Encounters:  09/01/24 39.22 kg/m  08/25/24 37.12 kg/m  06/26/24 38.58 kg/m    Physical Exam: Physical Exam   ENT: Normal mucosa. No hypertrophy of inferior turbinates. Tonsils are normal sized. Modified Mallampati score is normal. PULMONARY: Lungs clear to auscultation bilaterally, no adventitious breath sounds. CARDIOVASCULAR: Regular rate and rhythm, S1 S2 normal, no murmurs. ABDOMEN: Abdomen soft, nontender. Bowel sounds are normal. EXTREMITIES: No peripheral edema noted.       Diagnostic Review:  Last metabolic panel Lab Results  Component Value Date   GLUCOSE 111 (H) 12/10/2018   NA 137 12/10/2018   K 3.5 12/10/2018   CL 102 12/10/2018   CO2 21 (L) 12/10/2018   BUN 17 12/10/2018   CREATININE 1.41 (H) 12/10/2018   GFRNONAA 40 (L) 12/10/2018   CALCIUM  10.0 12/10/2018   PROT 8.2 (H) 12/06/2018   ALBUMIN 4.3 12/06/2018   LABGLOB 3.3 01/27/2013   AGRATIO 1.4 01/27/2013   BILITOT 0.7 12/06/2018   ALKPHOS 90 12/06/2018   AST 21 12/06/2018   ALT 20 12/06/2018   ANIONGAP 14 12/10/2018         Assessment & Plan:   Assessment & Plan OSA (obstructive sleep apnea)     Restless leg syndrome     Moderate persistent asthma without complication     Obesity, class 2        Assessment and Plan    Moderate persistent asthma Asthma symptoms worsened, likely due to weight gain. Family history of asthma and COPD. Unable to complete PFT. - Discontinued Asmanex. - Initiated Dulera, two puffs twice daily. - Continue albuterol as needed. - Continue omeprazole for acid reflux. - will get labwork in future if no improvement with inhalers.   Obstructive sleep apnea Mild obstructive sleep apnea confirmed by sleep study. Oxygenation slightly worse when supine. - Ordered CPAP machine through DME.  - Advised sleeping on side if possible.  Restless legs syndrome Symptoms occur 2-3 times weekly,  improve with movement. Iron levels borderline. Discussed iron infusions and ropinirole as treatment options. - ordered iron infusions if covered by insurance. - will prescribe ropinorole for intermittent RLS if iron infusion not covered or ineffective.  - behavioural strategies discussed.   Gastroesophageal reflux disease without esophagitis GERD managed with omeprazole. - Continue omeprazole.       Return in about 3 months (around 11/30/2024).   I personally spent a total of 30 minutes in the care of the patient today including preparing to see the patient, getting/reviewing separately obtained history, performing a medically appropriate exam/evaluation, counseling and educating, placing orders, documenting clinical information in the EHR, independently interpreting results, and communicating results.   Devina Bezold, MD

## 2024-09-01 NOTE — Addendum Note (Signed)
 Addended by: Maryln Eastham N on: 09/01/2024 02:29 PM   Modules accepted: Orders

## 2024-09-01 NOTE — Patient Instructions (Signed)
  VISIT SUMMARY:  Today, you came in for a follow-up appointment to discuss your asthma, recent sleep study, and other health concerns. We reviewed your symptoms, current medications, and test results to adjust your treatment plan accordingly.  YOUR PLAN:  -MODERATE PERSISTENT ASTHMA: Moderate persistent asthma means that your asthma symptoms are more frequent and may affect your daily activities. We believe your symptoms have worsened due to weight gain. We have discontinued Asthmanex and started you on Dulera, which you should take two puffs twice daily. Continue using Albuterol as needed for quick relief and keep taking omeprazole for acid reflux.  -OBSTRUCTIVE SLEEP APNEA: Obstructive sleep apnea is a condition where your breathing stops and starts during sleep. Your sleep study confirmed mild obstructive sleep apnea. We have ordered a CPAP machine to help you breathe better at night. Try to sleep on your side if possible, as this can improve your oxygen levels.  -RESTLESS LEGS SYNDROME: Restless legs syndrome causes an uncontrollable urge to move your legs, usually due to discomfort. Your symptoms occur a few times a week and improve with movement. Since your iron levels are borderline, we discussed iron infusions and prescribed ropinirole, which you can take as needed. Please try to distract yourself when in bed to help with RLS symptoms.   -GASTROESOPHAGEAL REFLUX DISEASE (GERD): GERD is a condition where stomach acid frequently flows back into the tube connecting your mouth and stomach. You are currently managing this with omeprazole, and we recommend you continue this medication.  INSTRUCTIONS:  Please follow up with us  after you start using the CPAP machine to discuss how it's working for you. If you decide to proceed with iron infusions, let us  know so we can coordinate that with your insurance. Continue taking your medications as prescribed and monitor your symptoms. If you have any new  or worsening symptoms, please contact our office.                      Contains text generated by Abridge.                                 Contains text generated by Abridge.

## 2024-09-01 NOTE — Patient Instructions (Signed)
 Patient unable to perform pft test today. Made provider aware.

## 2024-09-01 NOTE — Assessment & Plan Note (Addendum)
 Andrea Bean

## 2024-09-02 ENCOUNTER — Other Ambulatory Visit: Payer: Self-pay

## 2024-09-02 DIAGNOSIS — G2581 Restless legs syndrome: Secondary | ICD-10-CM | POA: Insufficient documentation

## 2024-09-02 NOTE — Progress Notes (Signed)
 Therapy plan placed for [facility] to start benefits investigation.    Diagnosis: RLS   Provider: Dr. Sammi Fredericks Last OV: 09/01/24   Dose: 510mg  at Day 0 then Day 7 (or after)   Serum ferritin: 176 ng/mL Transferrin ratio: 25 % Hgb: 14.2 g/dl   Aleck Puls, PharmD, BCPS Clinical Pharmacist  [facility]

## 2024-09-02 NOTE — Progress Notes (Signed)
 Received request for IV iron infusion from CC chart from Dr. Theodoro as follows:   Andrea Lakes, MD  [name], [facility] Can we please try to do iron infusion in this patient with feraheme 510mg  x 2 doses 5 days apart. If not equivalent venofer is ok.

## 2024-09-03 DIAGNOSIS — D509 Iron deficiency anemia, unspecified: Secondary | ICD-10-CM | POA: Insufficient documentation

## 2024-09-04 ENCOUNTER — Telehealth: Payer: Self-pay

## 2024-09-04 ENCOUNTER — Other Ambulatory Visit (HOSPITAL_COMMUNITY): Payer: Self-pay

## 2024-09-04 NOTE — Telephone Encounter (Signed)
 Dr. Pawar, patient will be scheduled as soon as possible.  Auth Submission: NO AUTH NEEDED Site of care: Site of care: [facility] Payer: [employer] medicare Medication & CPT/J Code(s) submitted: Venofer (Iron Sucrose) J1756 Diagnosis Code:  Route of submission (phone, fax, portal):  Phone # Fax # Auth type: Buy/Bill PB Units/visits requested: 200mg  x 5 doses Reference number:  Approval from: 09/04/24 to 09/30/24

## 2024-09-15 ENCOUNTER — Telehealth: Payer: Self-pay

## 2024-09-15 NOTE — Telephone Encounter (Signed)
 Date of Study: 08/25/24  Interpretation: Mild OSA with AHI 4% of 6.4/hr , O2 desaturation 2.8 min. O2 nadir 79%.   Plan: Initiate auto CPAP at 6-15 cm H2O and provide supplies.  Side sleep.   Sammi Fredericks, MD.

## 2024-09-15 NOTE — Procedures (Signed)
 Darryle Law [facility] [address] [place], [place] [address] Tel: [phone]   Fax: [phone]  Polysomnography Interpretation  Patient Name:  Andrea Bean, Andrea Bean Date:  08/25/2024 Referring Physician:  SAMMI FREDERICKS [id number]) %%startinterp%% Indications for Polysomnography The patient is a 63 year old Female who is 5' 6 and weighs 230.0 lbs. Her BMI equals 37.4.  A full night polysomnogram was performed to evaluate for -.  Patient reported taking her medication at 10:00 pm.  Trazodone    Polysomnogram Data A full night polysomnogram recorded the standard physiologic parameters including EEG, EOG, EMG, EKG, nasal and oral airflow.  Respiratory parameters of chest and abdominal movements were recorded with Respiratory Inductance Plethysmography belts.  Oxygen saturation was recorded by pulse oximetry.   Sleep Architecture The total recording time of the polysomnogram was 367.4 minutes.  The total sleep time was 245.0 minutes.  The patient spent 5.9% of total sleep time in Stage N1, 64.7% in Stage N2, 22.4% in Stages N3, and 6.9% in REM.  Sleep latency was 20.6 minutes.  REM latency was 147.5 minutes.  Sleep Efficiency was 66.7%.  Wake after Sleep Onset time was 101.5 minutes.  Respiratory Events The polysomnogram revealed a presence of 2 obstructive, - central, and - mixed apneas resulting in an Apnea index of 0.5 events per hour.  There were 58 hypopneas (>=3% desaturation and/or arousal) resulting in an Apnea\Hypopnea Index (AHI >=3% desaturation and/or arousal) of 14.7 events per hour.  There were 24 hypopneas (>=4% desaturation) resulting in an Apnea\Hypopnea Index (AHI >=4% desaturation) of 6.4 events per hour.  There were 25 Respiratory Effort Related Arousals resulting in a RERA index of 6.1 events per hour. The Respiratory Disturbance Index is 20.8 events per hour.  The snore index was - events per hour.  Mean oxygen saturation was 93.6%.   The lowest oxygen saturation during sleep was 79.0%.  Time spent <=88% oxygen saturation was 2.8 minutes (0.8%).  Limb Activity There were 19 total limb movements recorded, of this total, 19 were classified as PLMs.  PLM index was 4.7 per hour and PLM associated with Arousals index was 0.5 per hour.  Cardiac Summary The average pulse rate was 70.0 bpm.  The minimum pulse rate was 57.0 bpm while the maximum pulse rate was 99.0 bpm.  Cardiac rhythm was normal/abnormal.  Comments:  - The overall frequency of apnea/hypopnea (AHI) was 6.4 events per hour (14.7/hr 3%). The lowest oxyhemoglobin saturation was 79%.  Time spent below 88% was 2.8 min. -  REM dominant OSA though only 17 min of REM sleep achieved.  - PLMS Index 4.7/hr, which is clinically not relevant.  - macrofragmentation of sleep likely due to 1st night effect.  - occasional PVCs seen.   Diagnosis:  Mild OSA  Recommendations: - Treatment with auto CPAP 6-15 cm of H20   - Side sleeping is recommended to aid in managing sleep apnea.   - Close clinical follow-up with compliance monitoring is recommended to optimize treatment efficacy.   - Refrain from driving or operating heavy machinery when drowsy or after taking medications that may cause sedation.   -     evaluate for macrofragmentation of sleep and consider sleep zone restriction if indicated.   - Encourage maintenance of optimal weight and adhering regular sleep wake timing.     This study was personally reviewed and electronically signed by: Dr Sammi Fredericks Accredited Board Certified in Sleep Medicine Date/Time:     %%  endinterp%%   Diagnostic PSG Report  Patient Name: Andrea, Bean Date: 08/25/2024  Date of Birth: 1961/03/18 Study Type: Diagnostic  Age: 38 year MRN #: [id number]  Sex: Female Interpreting Physician: NEYSA RAMA, [id number]  Height: 5' 6 Referring Physician: SAMMI FREDERICKS [phone])  Weight: 230.0 lbs Recording Tech: Orie Sires RRT  RPSGT RST  BMI: 37.4 Scoring Tech: Orie Sires RRT RPSGT RST  ESS: 10 Neck Size: 15   Study Overview  Lights Off: 10:49:21 PM  Count Index  Lights On: 04:56:47 AM Awakenings: 19 4.7  Time in Bed: 367.4 min. Arousals: 66 16.2  Total Sleep Time: 245.0 min. AHI (>=3% Desat and/or Ar.): 60 14.7   Sleep Efficiency: 66.7% AHI (>=4% Desat): 26 6.4   Sleep Latency: 20.6 min. Limb Movements: 19 4.7  Wake After Sleep Onset: 101.5 min. Snore: - -  REM Latency from Sleep Onset: 147.5 min. Desaturations: 60 14.7     Minimum SpO2 TST: 79.0%    Sleep Architecture  % of Time in Bed Stages Time (mins) % Sleep Time  Wake 123.0   Stage N1 14.5 5.9%  Stage N2 158.5 64.7%  Stage N3 55.0 22.4%  REM 17.0 6.9%   Arousal Summary   NREM REM Sleep Index  Respiratory Arousals 28 1 29  7.1  PLM Arousals 2 - 2 0.5  Isolated Limb Movement Arousals - - - -  Snore Arousals - - - -  Spontaneous Arousals 34 1 35 8.6  Total 64 2 66 16.2   Limb Movement Summary   Count Index  Isolated Limb Movements - -  Periodic Limb Movements (PLMs) 19 4.7  Total Limb Movements 19 4.7    Respiratory Summary   By Sleep Stage By Body Position Total   NREM REM Supine Non-Supine   Time (min) 228.0 17.0 101.0 144.0 245.0         Obstructive Apnea - 2 - 2 2  Mixed Apnea - - - - -  Central Apnea - - - - -  Total Apneas - 2 - 2 2  Total Apnea Index - 7.1 - 0.8 0.5         Hypopneas (>=3% Desat and/or Ar.) 38 20 30 28  58  AHI (>=3% Desat and/or Ar.) 10.0 77.6 17.8 12.5 14.7         Hypopneas (>=4% Desat) 10 14 7 17 24   AHI (>=4% Desat) 2.6 56.5 4.2 7.9 6.4          RERAs 25 - 5 20 25   RERA Index 6.6 - 3.0 8.3 6.1         RDI 16.6 77.6 20.8 20.8 20.8    Respiratory Event Type Index  Central Apneas -  Obstructive Apneas 0.5  Mixed Apneas -  Central Hypopneas -  Obstructive Hypopneas 14.2  Central Apnea + Hypopnea (CAHI) -  Obstructive Apnea + Hypopnea (OAHI) 14.7   Respiratory Event Durations   Apnea  Hypopnea   NREM REM NREM REM  Average (seconds) - 12.1 18.4 14.2  Maximum (seconds) - 13.5 37.7 21.1    Oxygen Saturation Summary   Wake NREM REM TST TIB  Average SpO2 (%) 94.6% 93.2% 91.5% 93.1% 93.6%  Minimum SpO2 (%) 90.0% 86.0% 79.0% 79.0% 79.0%  Maximum SpO2 (%) 99.0% 98.0% 98.0% 98.0% 99.0%   Oxygen Saturation Distribution  Range (%) Time in range (min) Time in range (%)  90.0 - 100.0 359.0 97.9%  80.0 - 90.0 7.7 2.1%  70.0 - 80.0 0.1 0.0%  60.0 - 70.0 - -  50.0 - 60.0 - -  0.0 - 50.0 - -  Time Spent <=88% SpO2  Range (%) Time in range (min) Time in range (%)  0.0 - 88.0 2.8 0.8%      Count Index  Desaturations 60 14.7    Cardiac Summary   Wake NREM REM Sleep Total  Average Pulse Rate (BPM) 73.0 67.9 76.9 68.5 70.0  Minimum Pulse Rate (BPM) 61.0 57.0 64.0 57.0 57.0  Maximum Pulse Rate (BPM) 99.0 88.0 98.0 98.0 99.0   Pulse Rate Distribution:  Range (bpm) Time in range (min) Time in range (%)  0.0 - 40.0 - -  40.0 - 60.0 3.2 0.9%  60.0 - 80.0 342.4 93.2%  80.0 - 100.0 18.4 5.0%  100.0 - 120.0 - -  120.0 - 140.0 - -  140.0 - 200.0 - -      Hypnograms                      Technologist Comments  Patient was ordered as a NPSG. Patient is a 63 year old African American female who was sent to the sleep center for OSA. No oxygen was applied. Patient's study was performed in room # 3. Patient reported taking her medication at 10:00 pm. Occasional PLMA'S/PLM'S were noted. Snoring ranged from moderate to loud. Questionable cardiac arrhythmias were noted see attached sheets and epochs for examples: 96, 194, 198, 199, 368, 509, ETC. No restroom visits were noted.         Jakobi Thetford Diplomate, Biomedical Engineer of Sleep Medicine  ELECTRONICALLY SIGNED ON:  09/15/2024, 6:43 PM [facility] PH: [phone]   FX: [phone] ACCREDITED BY THE AMERICAN ACADEMY OF SLEEP MEDICINE

## 2024-09-16 NOTE — Telephone Encounter (Signed)
ATC x1.  LMTCB. 

## 2024-09-17 NOTE — Telephone Encounter (Signed)
 ATCx1 LVMTCB.  Sending MyChart message per protocol as pt has been unable to be reached.

## 2024-09-23 NOTE — Addendum Note (Signed)
 Addended byBETHA JESSICA BOUCHARD S on: 09/23/2024 08:57 AM   Modules accepted: Orders

## 2024-09-28 ENCOUNTER — Telehealth: Payer: Self-pay

## 2024-09-28 NOTE — Telephone Encounter (Signed)
 Fax confirmation received, [facility]

## 2024-09-28 NOTE — Telephone Encounter (Signed)
 Received fax from [facility] requesting signed Rx for CPAP. The requested information was faxed to [facility] at [phone].  Awaiting fax confirmation.

## 2024-09-29 ENCOUNTER — Ambulatory Visit (INDEPENDENT_AMBULATORY_CARE_PROVIDER_SITE_OTHER)

## 2024-09-29 ENCOUNTER — Telehealth: Payer: Self-pay

## 2024-09-29 VITALS — BP 131/77 | HR 80 | Temp 98.9°F | Resp 20 | Ht 66.0 in | Wt 237.0 lb

## 2024-09-29 DIAGNOSIS — D509 Iron deficiency anemia, unspecified: Secondary | ICD-10-CM | POA: Diagnosis not present

## 2024-09-29 DIAGNOSIS — G2581 Restless legs syndrome: Secondary | ICD-10-CM | POA: Diagnosis not present

## 2024-09-29 MED ORDER — IRON SUCROSE 200 MG IVPB - SIMPLE MED
200.0000 mg | Freq: Once | Status: AC
  Administered 2024-09-29: 200 mg via INTRAVENOUS

## 2024-09-29 NOTE — Progress Notes (Signed)
 Diagnosis: Iron  Deficiency Anemia  Provider:  Praveen Mannam MD  Procedure: IV Infusion  IV Type: Peripheral, IV Location: L Antecubital   Venofer  (Iron  Sucrose), Dose: 200 mg  Infusion Start Time: 1521  Infusion Stop Time: 1536  Post Infusion IV Care: Observation period completed and Peripheral IV Discontinued  Discharge: Condition: Good, Destination: Home . AVS Provided  Performed by:  Leita FORBES Miles, LPN

## 2024-09-29 NOTE — Telephone Encounter (Signed)
 This was the order and the pressure settings are clearly there: Routing to [facility] to see why they can not process the order. Thanks!  Type Date User Summary Attachment  Provider Comments 09/23/2024  8:57 AM Heater, Duwaine RAMAN, CMA Provider Comments -  Note: Patient has OSA or probable OSA (Yes or No): osa Is the patient currently using CPAP within the home? (Yes or No): no If yes to question 2, what is the current DME provider?  Are there any changes to the order/settings? (if yes, please specify)  If no to question 2, date of sleep study: 08/25/24 Date of face-to-face encounter: 09/01/2024 Settings: 6-15 cm H2O Auto Signs and symptoms or probable OSA, mention all that apply (snoring) (morning headaches) (witnessed apneas) (choking) (gasping during sleep):  Resmed or DreamStation air/auto with heated humidity-DO NOT SUBSTITUTE  Enroll in [employer] Provide patient with SD card when [employer] enrollment expires CPAP supplies needed: mask of choice, headgear, cushions, filters, climate control tubing and water chamber. Heated humidity with all PAP supplies.  Length of Need: Lifetime

## 2024-09-29 NOTE — Telephone Encounter (Signed)
 Copied from CRM [id number]. Topic: General - Other >> Sep 29, 2024 12:31 PM Whitney O wrote: Reason for CRM: ms rhonda from [employer] calling about the order they received for patient and everything was ok but the pressure settings were missing . We are needing that information to complete the order  [phone] fax number  [phone] call back number just in case

## 2024-10-05 ENCOUNTER — Ambulatory Visit

## 2024-10-05 VITALS — BP 120/75 | HR 69 | Temp 97.9°F | Resp 18 | Ht 66.0 in | Wt 238.6 lb

## 2024-10-05 DIAGNOSIS — D509 Iron deficiency anemia, unspecified: Secondary | ICD-10-CM | POA: Diagnosis not present

## 2024-10-05 DIAGNOSIS — G2581 Restless legs syndrome: Secondary | ICD-10-CM | POA: Diagnosis not present

## 2024-10-05 MED ORDER — IRON SUCROSE 200 MG IVPB - SIMPLE MED
200.0000 mg | Freq: Once | Status: AC
  Administered 2024-10-05: 200 mg via INTRAVENOUS

## 2024-10-05 NOTE — Progress Notes (Signed)
 Diagnosis: Iron  Deficiency Anemia  Provider:  Praveen Mannam MD  Procedure: IV Infusion  IV Type: Peripheral, IV Location: R Forearm  Venofer  (Iron  Sucrose), Dose: 200 mg  Infusion Start Time: 1516  Infusion Stop Time: 1535  Post Infusion IV Care: Patient declined observation and Peripheral IV Discontinued  Discharge: Condition: Good, Destination: Home . AVS Declined  Performed by:  Iam Lipson, RN

## 2024-10-07 ENCOUNTER — Ambulatory Visit

## 2024-10-07 ENCOUNTER — Telehealth: Payer: Self-pay

## 2024-10-07 VITALS — BP 120/75 | HR 80 | Temp 98.1°F | Resp 16 | Ht 66.0 in | Wt 237.8 lb

## 2024-10-07 DIAGNOSIS — D509 Iron deficiency anemia, unspecified: Secondary | ICD-10-CM | POA: Diagnosis not present

## 2024-10-07 DIAGNOSIS — G2581 Restless legs syndrome: Secondary | ICD-10-CM

## 2024-10-07 MED ORDER — IRON SUCROSE 200 MG IVPB - SIMPLE MED
200.0000 mg | Freq: Once | Status: AC
  Administered 2024-10-07: 200 mg via INTRAVENOUS
  Filled 2024-10-07: qty 110

## 2024-10-07 NOTE — Progress Notes (Signed)
 Diagnosis: , Acute Anemia  Provider:  Lonna Coder MD  Procedure: IV Infusion  IV Type: Peripheral, IV Location: L Hand  , Venofer  (Iron  Sucrose), Dose: 200 mg  Infusion Start Time: 1528  Infusion Stop Time: 1545  Post Infusion IV Care: Patient declined observation and Peripheral IV Discontinued  Discharge: Condition: Good, Destination: Home . AVS Declined  Performed by:  Donny Childes, RN

## 2024-10-07 NOTE — Telephone Encounter (Signed)
 Received fax from [facility] [employer]. [place], [place] for CMN.  Fax back to [phone]

## 2024-10-09 ENCOUNTER — Ambulatory Visit

## 2024-10-09 VITALS — BP 126/78 | HR 75 | Temp 98.1°F | Resp 20 | Ht 66.0 in | Wt 238.0 lb

## 2024-10-09 DIAGNOSIS — G2581 Restless legs syndrome: Secondary | ICD-10-CM | POA: Diagnosis not present

## 2024-10-09 DIAGNOSIS — D509 Iron deficiency anemia, unspecified: Secondary | ICD-10-CM | POA: Diagnosis not present

## 2024-10-09 MED ORDER — IRON SUCROSE 200 MG IVPB - SIMPLE MED
200.0000 mg | Freq: Once | Status: AC
  Administered 2024-10-09: 200 mg via INTRAVENOUS
  Filled 2024-10-09: qty 110

## 2024-10-09 NOTE — Progress Notes (Signed)
 Diagnosis: , Acute Anemia  Provider:  Lonna Coder MD  Procedure: IV Infusion  IV Type: Peripheral, IV Location: R Hand  , Venofer  (Iron  Sucrose), Dose: 200 mg  Infusion Start Time: 1522  Infusion Stop Time: 1540  Post Infusion IV Care: Patient declined observation and Peripheral IV Discontinued  Discharge: Condition: Good, Destination: Home . AVS Declined  Performed by:  Donny Childes, RN

## 2024-10-12 ENCOUNTER — Ambulatory Visit: Admitting: *Deleted

## 2024-10-12 VITALS — BP 124/75 | HR 83 | Temp 98.0°F | Resp 18 | Ht 66.0 in | Wt 237.8 lb

## 2024-10-12 DIAGNOSIS — D509 Iron deficiency anemia, unspecified: Secondary | ICD-10-CM | POA: Diagnosis not present

## 2024-10-12 DIAGNOSIS — G2581 Restless legs syndrome: Secondary | ICD-10-CM

## 2024-10-12 MED ORDER — IRON SUCROSE 200 MG IVPB - SIMPLE MED
200.0000 mg | Freq: Once | Status: AC
  Administered 2024-10-12: 200 mg via INTRAVENOUS
  Filled 2024-10-12: qty 110

## 2024-10-12 NOTE — Progress Notes (Signed)
 Diagnosis: Iron  Deficiency Anemia  Provider:  Mannam, Praveen MD  Procedure: IV Infusion  IV Type: Peripheral, IV Location: L Antecubital  Venofer  (Iron  Sucrose), Dose: 200 mg  Infusion Start Time: 1537  Infusion Stop Time: 1552  Post Infusion IV Care: Patient declined observation and Peripheral IV Discontinued  Discharge: Condition: Good, Destination: Home . AVS Declined  Performed by:  Monte Bronder E, RN

## 2024-10-16 NOTE — Addendum Note (Signed)
 Addended by: DAYNE SHERRY RAMAN on: 10/16/2024 02:36 PM   Modules accepted: Orders

## 2024-10-27 NOTE — Procedures (Addendum)
" °  Indications for Polysomnography The patient is a 64 year old Female who is 5' 6 and weighs 230.0 lbs. Her BMI equals 37.4.  A full night polysomnogram was performed to evaluate for -.  Patient reported taking her medication at 10:00 pm.Trazodone  Polysomnogram Data A full night polysomnogram recorded the standard physiologic parameters including EEG, EOG, EMG, EKG, nasal and oral airflow.  Respiratory parameters of chest and abdominal movements were recorded with Respiratory Inductance Plethysmography belts.   Oxygen saturation was recorded by pulse oximetry.  Sleep Architecture The total recording time of the polysomnogram was 367.4 minutes.  The total sleep time was 245.0 minutes.  The patient spent 5.9% of total sleep time in Stage N1, 64.7% in Stage N2, 22.4% in Stages N3, and 6.9% in REM.  Sleep latency was 20.6 minutes.   REM latency was 147.5 minutes.  Sleep Efficiency was 66.7%.  Wake after Sleep Onset time was 101.5 minutes.  Respiratory Events The polysomnogram revealed a presence of 2 obstructive, - central, and - mixed apneas resulting in an Apnea index of 0.5 events per hour.  There were 58 hypopneas (GreaterEqual to3% desaturation and/or arousal) resulting in an Apnea\Hypopnea Index (AHI  GreaterEqual to3% desaturation and/or arousal) of 14.7 events per hour.  There were 24 hypopneas (GreaterEqual to4% desaturation) resulting in an Apnea\Hypopnea Index (AHI GreaterEqual to4% desaturation) of 6.4 events per hour.  There were 25 Respiratory  Effort Related Arousals resulting in a RERA index of 6.1 events per hour. The Respiratory Disturbance Index is 20.8 events per hour.  The snore index was - events per hour.  Mean oxygen saturation was 93.6%.  The lowest oxygen saturation during sleep was 79.0%.  Time spent LessEqual to88% oxygen saturation was  minutes ().  Limb Activity There were 19 total limb movements recorded, of this total, 19 were classified as PLMs.  PLM index was 4.7  per hour and PLM associated with Arousals index was 0.5 per hour.  Cardiac Summary The average pulse rate was 70.0 bpm.  The minimum pulse rate was 57.0 bpm while the maximum pulse rate was 99.0 bpm.  Cardiac rhythm was normal/abnormal.  Comments: - The overall frequency of apnea/hypopnea (AHI) was 6.4 events per hour (14.7/hr 3%). The lowest oxyhemoglobin saturation was 79%.  Time spent below 88% was 2.8 min. -  REM dominant OSA though only 17 min of REM sleep achieved. - PLMS Index 4.7/hr, which is clinically not relevant. - macrofragmentation of sleep likely due to 1st night effect. - occasional PVCs seen.  Diagnosis: Mild OSA  Recommendations: - Treatment with auto CPAP 6-15 cm of H20  - Side sleeping is recommended to aid in managing sleep apnea.  - Close clinical follow-up with compliance monitoring is recommended to optimize treatment efficacy.  - Refrain from driving or operating heavy machinery when drowsy or after taking medications that may cause sedation.  -     evaluate for macrofragmentation of sleep and consider sleep zone restriction if indicated.  - Encourage maintenance of optimal weight and adhering regular sleep wake timing.     This study was personally reviewed and electronically signed by: Dr Sammi Fredericks Accredited Board Certified in Sleep Medicine Date/Time: "

## 2024-11-30 ENCOUNTER — Ambulatory Visit
